# Patient Record
Sex: Male | Born: 1990 | Race: Black or African American | Hispanic: No | Marital: Single | Smoking: Never smoker
Health system: Southern US, Community
[De-identification: ages and names within clinical notes are randomized; demographics above are authoritative.]

## PROBLEM LIST (undated history)

## (undated) DIAGNOSIS — J45909 Unspecified asthma, uncomplicated: Secondary | ICD-10-CM

---

## 2013-09-16 ENCOUNTER — Other Ambulatory Visit: Payer: Self-pay | Admitting: Family Medicine

## 2013-09-16 ENCOUNTER — Ambulatory Visit
Admission: RE | Admit: 2013-09-16 | Discharge: 2013-09-16 | Disposition: A | Discharge status: Home / Self Care | Payer: Managed Care, Other (non HMO) | Source: Ambulatory Visit | Attending: Family Medicine | Admitting: Family Medicine

## 2013-09-16 DIAGNOSIS — M79609 Pain in unspecified limb: Secondary | ICD-10-CM

## 2015-04-26 ENCOUNTER — Emergency Department (HOSPITAL_COMMUNITY): Payer: Worker's Compensation | Admitting: Anesthesiology

## 2015-04-26 ENCOUNTER — Inpatient Hospital Stay (HOSPITAL_COMMUNITY)
Admission: EM | Admit: 2015-04-26 | Discharge: 2015-05-01 | DRG: 464 | Disposition: A | Discharge status: Home Health | Payer: Worker's Compensation | Attending: Orthopedic Surgery | Admitting: Orthopedic Surgery

## 2015-04-26 ENCOUNTER — Emergency Department (HOSPITAL_COMMUNITY): Payer: Worker's Compensation

## 2015-04-26 ENCOUNTER — Encounter (HOSPITAL_COMMUNITY): Payer: Self-pay | Admitting: Neurology

## 2015-04-26 ENCOUNTER — Encounter (HOSPITAL_COMMUNITY)
Admission: EM | Disposition: A | Discharge status: Home Health | Payer: Self-pay | Source: Home / Self Care | Attending: Orthopedic Surgery

## 2015-04-26 DIAGNOSIS — W319XXA Contact with unspecified machinery, initial encounter: Secondary | ICD-10-CM | POA: Diagnosis not present

## 2015-04-26 DIAGNOSIS — Z880 Allergy status to penicillin: Secondary | ICD-10-CM

## 2015-04-26 DIAGNOSIS — Y9269 Other specified industrial and construction area as the place of occurrence of the external cause: Secondary | ICD-10-CM

## 2015-04-26 DIAGNOSIS — S62151A Displaced fracture of hook process of hamate [unciform] bone, right wrist, initial encounter for closed fracture: Secondary | ICD-10-CM | POA: Diagnosis present

## 2015-04-26 DIAGNOSIS — S52611A Displaced fracture of right ulna styloid process, initial encounter for closed fracture: Secondary | ICD-10-CM | POA: Diagnosis present

## 2015-04-26 DIAGNOSIS — Z881 Allergy status to other antibiotic agents status: Secondary | ICD-10-CM | POA: Diagnosis not present

## 2015-04-26 DIAGNOSIS — S62302A Unspecified fracture of third metacarpal bone, right hand, initial encounter for closed fracture: Secondary | ICD-10-CM | POA: Diagnosis present

## 2015-04-26 DIAGNOSIS — T79A11A Traumatic compartment syndrome of right upper extremity, initial encounter: Secondary | ICD-10-CM | POA: Diagnosis present

## 2015-04-26 DIAGNOSIS — S62304A Unspecified fracture of fourth metacarpal bone, right hand, initial encounter for closed fracture: Secondary | ICD-10-CM | POA: Diagnosis present

## 2015-04-26 DIAGNOSIS — S62309A Unspecified fracture of unspecified metacarpal bone, initial encounter for closed fracture: Secondary | ICD-10-CM

## 2015-04-26 DIAGNOSIS — S4991XA Unspecified injury of right shoulder and upper arm, initial encounter: Secondary | ICD-10-CM

## 2015-04-26 DIAGNOSIS — S6720XA Crushing injury of unspecified hand, initial encounter: Secondary | ICD-10-CM | POA: Diagnosis present

## 2015-04-26 DIAGNOSIS — S52501A Unspecified fracture of the lower end of right radius, initial encounter for closed fracture: Secondary | ICD-10-CM | POA: Diagnosis present

## 2015-04-26 HISTORY — PX: ORIF WRIST FRACTURE: SHX2133

## 2015-04-26 HISTORY — PX: FASCIECTOMY: SHX6525

## 2015-04-26 SURGERY — OPEN REDUCTION INTERNAL FIXATION (ORIF) WRIST FRACTURE
Anesthesia: General | Laterality: Right

## 2015-04-26 MED ORDER — ONDANSETRON HCL 4 MG/2ML IJ SOLN
INTRAMUSCULAR | Status: DC | PRN
  Administered 2015-04-26: 4 mg via INTRAVENOUS

## 2015-04-26 MED ORDER — SODIUM CHLORIDE 0.9 % IJ SOLN
INTRAMUSCULAR | Status: AC
  Filled 2015-04-26: qty 10

## 2015-04-26 MED ORDER — HYDROMORPHONE HCL 1 MG/ML IJ SOLN
0.2500 mg | INTRAMUSCULAR | Status: DC | PRN
  Administered 2015-04-26 (×2): 0.5 mg via INTRAVENOUS

## 2015-04-26 MED ORDER — MIDAZOLAM HCL 2 MG/2ML IJ SOLN
INTRAMUSCULAR | Status: AC
  Filled 2015-04-26: qty 2

## 2015-04-26 MED ORDER — LIDOCAINE HCL (CARDIAC) 20 MG/ML IV SOLN
INTRAVENOUS | Status: DC | PRN
  Administered 2015-04-26: 100 mg via INTRAVENOUS

## 2015-04-26 MED ORDER — VANCOMYCIN HCL 1000 MG IV SOLR
1000.0000 mg | INTRAVENOUS | Status: DC | PRN
  Administered 2015-04-26: 1000 mg via INTRAVENOUS

## 2015-04-26 MED ORDER — VANCOMYCIN HCL IN DEXTROSE 1-5 GM/200ML-% IV SOLN
INTRAVENOUS | Status: AC
  Filled 2015-04-26: qty 200

## 2015-04-26 MED ORDER — KETOROLAC TROMETHAMINE 30 MG/ML IJ SOLN
30.0000 mg | Freq: Once | INTRAMUSCULAR | Status: AC | PRN
  Administered 2015-04-26: 30 mg via INTRAVENOUS

## 2015-04-26 MED ORDER — ONDANSETRON HCL 4 MG/2ML IJ SOLN
INTRAMUSCULAR | Status: AC
  Filled 2015-04-26: qty 2

## 2015-04-26 MED ORDER — HYDROMORPHONE HCL 1 MG/ML IJ SOLN
INTRAMUSCULAR | Status: AC
  Filled 2015-04-26: qty 1

## 2015-04-26 MED ORDER — LIDOCAINE HCL (CARDIAC) 20 MG/ML IV SOLN
INTRAVENOUS | Status: AC
  Filled 2015-04-26: qty 5

## 2015-04-26 MED ORDER — BACITRACIN ZINC 500 UNIT/GM EX OINT
TOPICAL_OINTMENT | CUTANEOUS | Status: AC
  Filled 2015-04-26: qty 28.35

## 2015-04-26 MED ORDER — BUPIVACAINE HCL (PF) 0.25 % IJ SOLN
INTRAMUSCULAR | Status: AC
  Filled 2015-04-26: qty 30

## 2015-04-26 MED ORDER — LACTATED RINGERS IV SOLN
INTRAVENOUS | Status: DC
  Administered 2015-04-27 – 2015-04-29 (×3): via INTRAVENOUS

## 2015-04-26 MED ORDER — PROPOFOL 10 MG/ML IV BOLUS
INTRAVENOUS | Status: DC | PRN
  Administered 2015-04-26: 150 mg via INTRAVENOUS

## 2015-04-26 MED ORDER — FENTANYL CITRATE (PF) 100 MCG/2ML IJ SOLN: 50.0000 ug | INTRAMUSCULAR | Status: DC | PRN

## 2015-04-26 MED ORDER — KETOROLAC TROMETHAMINE 30 MG/ML IJ SOLN
INTRAMUSCULAR | Status: AC
  Filled 2015-04-26: qty 1

## 2015-04-26 MED ORDER — DOUBLE ANTIBIOTIC 500-10000 UNIT/GM EX OINT
TOPICAL_OINTMENT | CUTANEOUS | Status: AC
  Filled 2015-04-26: qty 1

## 2015-04-26 MED ORDER — DEXAMETHASONE SODIUM PHOSPHATE 4 MG/ML IJ SOLN
INTRAMUSCULAR | Status: DC | PRN
  Administered 2015-04-26: 4 mg via INTRAVENOUS

## 2015-04-26 MED ORDER — SUCCINYLCHOLINE CHLORIDE 20 MG/ML IJ SOLN
INTRAMUSCULAR | Status: DC | PRN
  Administered 2015-04-26: 100 mg via INTRAVENOUS

## 2015-04-26 MED ORDER — DEXAMETHASONE SODIUM PHOSPHATE 4 MG/ML IJ SOLN
INTRAMUSCULAR | Status: AC
  Filled 2015-04-26: qty 1

## 2015-04-26 MED ORDER — LACTATED RINGERS IV SOLN
INTRAVENOUS | Status: DC | PRN
  Administered 2015-04-26 (×2): via INTRAVENOUS

## 2015-04-26 MED ORDER — MIDAZOLAM HCL 5 MG/5ML IJ SOLN
INTRAMUSCULAR | Status: DC | PRN
  Administered 2015-04-26: 2 mg via INTRAVENOUS

## 2015-04-26 MED ORDER — SUFENTANIL CITRATE 50 MCG/ML IV SOLN
INTRAVENOUS | Status: AC
  Filled 2015-04-26: qty 1

## 2015-04-26 MED ORDER — PROMETHAZINE HCL 25 MG/ML IJ SOLN
6.2500 mg | INTRAMUSCULAR | Status: DC | PRN
  Filled 2015-04-26: qty 1

## 2015-04-26 MED ORDER — SUCCINYLCHOLINE CHLORIDE 20 MG/ML IJ SOLN
INTRAMUSCULAR | Status: AC
  Filled 2015-04-26: qty 1

## 2015-04-26 MED ORDER — PROPOFOL 10 MG/ML IV BOLUS
INTRAVENOUS | Status: AC
  Filled 2015-04-26: qty 20

## 2015-04-26 MED ORDER — 0.9 % SODIUM CHLORIDE (POUR BTL) OPTIME
TOPICAL | Status: DC | PRN
  Administered 2015-04-26: 1000 mL

## 2015-04-26 MED ORDER — OXYCODONE HCL 5 MG PO TABS
5.0000 mg | ORAL_TABLET | ORAL | Status: DC | PRN
  Administered 2015-04-27 (×3): 10 mg via ORAL
  Administered 2015-04-30 – 2015-05-01 (×2): 5 mg via ORAL
  Filled 2015-04-26: qty 1
  Filled 2015-04-26 (×2): qty 2
  Filled 2015-04-26: qty 1
  Filled 2015-04-26 (×2): qty 2

## 2015-04-26 MED ORDER — TETANUS-DIPHTH-ACELL PERTUSSIS 5-2.5-18.5 LF-MCG/0.5 IM SUSP
0.5000 mL | Freq: Once | INTRAMUSCULAR | Status: AC
  Administered 2015-04-26: 0.5 mL via INTRAMUSCULAR
  Filled 2015-04-26: qty 0.5

## 2015-04-26 MED ORDER — HYDROMORPHONE HCL 1 MG/ML IJ SOLN
1.0000 mg | INTRAMUSCULAR | Status: DC | PRN
  Administered 2015-04-26: 1 mg via INTRAVENOUS

## 2015-04-26 MED ORDER — SUFENTANIL CITRATE 50 MCG/ML IV SOLN
INTRAVENOUS | Status: DC | PRN
  Administered 2015-04-26 (×2): 10 ug via INTRAVENOUS

## 2015-04-26 SURGICAL SUPPLY — 78 items
BANDAGE ELASTIC 3 VELCRO ST LF (GAUZE/BANDAGES/DRESSINGS) ×3 IMPLANT
BANDAGE ELASTIC 4 VELCRO ST LF (GAUZE/BANDAGES/DRESSINGS) ×3 IMPLANT
BIT DRILL 2.2 SS TIBIAL (BIT) ×3 IMPLANT
BIT DRILL MINI LNG ACUTRAK 2 (BIT) ×1 IMPLANT
BLADE SURG ROTATE 9660 (MISCELLANEOUS) IMPLANT
BNDG ESMARK 4X9 LF (GAUZE/BANDAGES/DRESSINGS) ×3 IMPLANT
BNDG GAUZE ELAST 4 BULKY (GAUZE/BANDAGES/DRESSINGS) ×3 IMPLANT
CANISTER WOUND CARE 500ML ATS (WOUND CARE) ×3 IMPLANT
CORDS BIPOLAR (ELECTRODE) ×3 IMPLANT
COVER SURGICAL LIGHT HANDLE (MISCELLANEOUS) ×3 IMPLANT
CUFF TOURNIQUET SINGLE 18IN (TOURNIQUET CUFF) ×3 IMPLANT
CUFF TOURNIQUET SINGLE 24IN (TOURNIQUET CUFF) IMPLANT
DECANTER SPIKE VIAL GLASS SM (MISCELLANEOUS) IMPLANT
DRAIN TLS ROUND 10FR (DRAIN) IMPLANT
DRAPE INCISE IOBAN 66X45 STRL (DRAPES) ×3 IMPLANT
DRAPE OEC MINIVIEW 54X84 (DRAPES) IMPLANT
DRAPE SURG 17X23 STRL (DRAPES) ×3 IMPLANT
DRAPE U-SHAPE 47X51 STRL (DRAPES) ×3 IMPLANT
DRILL MINI LNG ACUTRAK 2 (BIT) ×3
DRSG ADAPTIC 3X8 NADH LF (GAUZE/BANDAGES/DRESSINGS) ×3 IMPLANT
DRSG VAC ATS SM SENSATRAC (GAUZE/BANDAGES/DRESSINGS) ×3 IMPLANT
GAUZE SPONGE 4X4 12PLY STRL (GAUZE/BANDAGES/DRESSINGS) ×3 IMPLANT
GAUZE SPONGE 4X4 16PLY XRAY LF (GAUZE/BANDAGES/DRESSINGS) ×3 IMPLANT
GAUZE XEROFORM 1X8 LF (GAUZE/BANDAGES/DRESSINGS) ×3 IMPLANT
GAUZE XEROFORM 5X9 LF (GAUZE/BANDAGES/DRESSINGS) ×3 IMPLANT
GLOVE BIOGEL M STRL SZ7.5 (GLOVE) ×3 IMPLANT
GLOVE ORTHO TXT STRL SZ7.5 (GLOVE) ×6 IMPLANT
GLOVE SS BIOGEL STRL SZ 8 (GLOVE) ×3 IMPLANT
GLOVE SUPERSENSE BIOGEL SZ 8 (GLOVE) ×6
GOWN STRL REUS W/ TWL LRG LVL3 (GOWN DISPOSABLE) ×3 IMPLANT
GOWN STRL REUS W/ TWL XL LVL3 (GOWN DISPOSABLE) ×3 IMPLANT
GOWN STRL REUS W/TWL LRG LVL3 (GOWN DISPOSABLE) ×6
GOWN STRL REUS W/TWL XL LVL3 (GOWN DISPOSABLE) ×6
GUIDEWIRE ORTHO 0.054X7 SS (WIRE) ×3 IMPLANT
GUIDEWIRE ORTHO MINI ACTK .045 (WIRE) ×3 IMPLANT
KIT BASIN OR (CUSTOM PROCEDURE TRAY) ×3 IMPLANT
KIT ROOM TURNOVER OR (KITS) ×3 IMPLANT
LOOP VESSEL MAXI BLUE (MISCELLANEOUS) IMPLANT
MANIFOLD NEPTUNE II (INSTRUMENTS) ×3 IMPLANT
NEEDLE 22X1 1/2 (OR ONLY) (NEEDLE) IMPLANT
NS IRRIG 1000ML POUR BTL (IV SOLUTION) ×3 IMPLANT
PACK ORTHO EXTREMITY (CUSTOM PROCEDURE TRAY) ×3 IMPLANT
PAD ARMBOARD 7.5X6 YLW CONV (MISCELLANEOUS) ×6 IMPLANT
PAD CAST 3X4 CTTN HI CHSV (CAST SUPPLIES) ×1 IMPLANT
PAD CAST 4YDX4 CTTN HI CHSV (CAST SUPPLIES) ×1 IMPLANT
PADDING CAST COTTON 3X4 STRL (CAST SUPPLIES) ×2
PADDING CAST COTTON 4X4 STRL (CAST SUPPLIES) ×2
PEG LOCKING SMOOTH 2.2X20 (Screw) ×3 IMPLANT
PLATE NARROW DVR RIGHT (Plate) ×3 IMPLANT
SCREW ACUTRAK 2 MINI 20MM (Screw) ×3 IMPLANT
SCREW LOCK 16X2.7X 3 LD TPR (Screw) ×1 IMPLANT
SCREW LOCK 18X2.7X 3 LD TPR (Screw) ×1 IMPLANT
SCREW LOCK 20X2.7X 3 LD TPR (Screw) ×1 IMPLANT
SCREW LOCK 22X2.7X 3 LD TPR (Screw) ×3 IMPLANT
SCREW LOCKING 2.7X15MM (Screw) ×3 IMPLANT
SCREW LOCKING 2.7X16 (Screw) ×2 IMPLANT
SCREW LOCKING 2.7X18 (Screw) ×2 IMPLANT
SCREW LOCKING 2.7X20MM (Screw) ×2 IMPLANT
SCREW LOCKING 2.7X22MM (Screw) ×6 IMPLANT
SPLINT FIBERGLASS 3X12 (CAST SUPPLIES) ×6 IMPLANT
SPLINT FIBERGLASS 4X30 (CAST SUPPLIES) ×3 IMPLANT
SPONGE GAUZE 4X4 12PLY STER LF (GAUZE/BANDAGES/DRESSINGS) ×3 IMPLANT
SPONGE LAP 4X18 X RAY DECT (DISPOSABLE) IMPLANT
SUCTION FRAZIER TIP 8 FR DISP (SUCTIONS) ×2
SUCTION TUBE FRAZIER 8FR DISP (SUCTIONS) ×1 IMPLANT
SUT BONE WAX W31G (SUTURE) ×3 IMPLANT
SUT MNCRL AB 4-0 PS2 18 (SUTURE) ×3 IMPLANT
SUT PROLENE 3 0 PS 2 (SUTURE) ×9 IMPLANT
SUT VIC AB 3-0 FS2 27 (SUTURE) ×3 IMPLANT
SYR CONTROL 10ML LL (SYRINGE) IMPLANT
SYSTEM CHEST DRAIN TLS 7FR (DRAIN) IMPLANT
TOWEL OR 17X24 6PK STRL BLUE (TOWEL DISPOSABLE) ×3 IMPLANT
TOWEL OR 17X26 10 PK STRL BLUE (TOWEL DISPOSABLE) ×3 IMPLANT
TUBE CONNECTING 12'X1/4 (SUCTIONS) ×1
TUBE CONNECTING 12X1/4 (SUCTIONS) ×2 IMPLANT
TUBE EVACUATION TLS (MISCELLANEOUS) ×3 IMPLANT
UNDERPAD 30X30 INCONTINENT (UNDERPADS AND DIAPERS) ×3 IMPLANT
WATER STERILE IRR 1000ML POUR (IV SOLUTION) ×3 IMPLANT

## 2015-04-26 NOTE — Anesthesia Postprocedure Evaluation (Signed)
  Anesthesia Post-op Note  Patient: Jerome Horton  Procedure(s) Performed: Procedure(s) (LRB): OPEN REDUCTION INTERNAL FIXATION (ORIF) WRIST FRACTURE (Right) FASCIECTOMY (Right)  Patient Location: PACU  Anesthesia Type: General  Level of Consciousness: awake and alert   Airway and Oxygen Therapy: Patient Spontanous Breathing  Post-op Pain: mild  Post-op Assessment: Post-op Vital signs reviewed, Patient's Cardiovascular Status Stable, Respiratory Function Stable, Patent Airway and No signs of Nausea or vomiting  Last Vitals:  Filed Vitals:   04/26/15 2245  BP:   Pulse: 78  Temp:   Resp: 14    Post-op Vital Signs: stable   Complications: No apparent anesthesia complications

## 2015-04-26 NOTE — ED Notes (Signed)
Per ems- [place] [employer], a piece of luggage was about to fall when he tried to catch it and his arm was bent backwards by the conveyor belt. Closed deformity to right forearm and wrist. Given 150 fentanyl, BP 148/86, HR 80.

## 2015-04-26 NOTE — H&P (Signed)
Jerome Horton is an 24 y.o. male.   Chief Complaint: Crush injury to the right hand and wrist HPI: The patient is a pleasant 24 year old male who presents to the emergency room setting for an acute evaluation of his right wrist. He was involved in an on-the-job injury earlier today approximately 3 PM. He is employed at [employer] and was loading luggage in his hand and the distal wrist region became trapped in the conveyor belt. This resulted in a crushing injury to the hand and wrist. The patient had increasing swelling pain and disability to the hand he was seen and evaluated emergency room setting. He was noted to have fractures of the third and fourth metacarpal, transverse in nature as well as a displaced interarticular distal radius fracture and ulna styloid fracture. We will consult in regards to his upper extremity injury. Patient complains of significant pain about the hand and difficulty moving the fingers secondary to pain. He denies any significant numbness at this juncture. He denies any injury to the proximal forearm and elbow or shoulder or contralateral upper extremity or lower extremities.  History reviewed. No pertinent past medical history.  History reviewed. No pertinent past surgical history.  No family history on file. Social History:  reports that he has never smoked. He does not have any smokeless tobacco history on file. He reports that he does not drink alcohol. His drug history is not on file.  Allergies:  Allergies  Allergen Reactions  . Amoxicillin Anaphylaxis and Hives  . Penicillins Anaphylaxis and Hives     (Not in a hospital admission)  No results found for this or any previous visit (from the past 48 hour(s)). Dg Forearm Right  04/26/2015   CLINICAL DATA:  Right upper extremity [place] a [place] convey [place].  EXAM: RIGHT FOREARM - 2 VIEW  COMPARISON:  None.  FINDINGS: There are transverse fractures of the distal radius and ulnar styloid base  with slight dorsal displacement. The radius fracture spares the articular surface. There also are fractures of the third and fourth metacarpal midshafts, nondisplaced. There is no radiopaque foreign body.  IMPRESSION: Fractures of the distal radius and ulnar styloid. Fractures of third and fourth metacarpals.   Electronically Signed   By: Ellery Plunk M.D.   On: 04/26/2015 17:26   Dg Hand 2 View Right  04/26/2015   CLINICAL DATA:  Hand caught [place] conveyor [place] the airport.  EXAM: RIGHT HAND - 2 VIEW  COMPARISON:  Right index finger radiographs 09/16/2013  FINDINGS: The transverse fracture is present through the midshaft of the third and fourth metatarsals without significant displacement. There is a comminuted distal radius fracture with probable intra-articular extension and slight dorsal angulation. An ulnar styloid fracture is evident. No additional fractures are present. Extensive soft tissue swelling is present over the hand and wrist. No radiopaque foreign body is evident.  IMPRESSION: 1. Nondisplaced transverse fractures through the midshaft of the third and fourth metacarpals. 2. Comminuted distal radius fracture with slight dorsal angulation probable intra-articular extension. 3. Nondisplaced ulnar styloid fracture. 4. Extensive soft tissue swelling without radiopaque foreign body.   Electronically Signed   By: Marin Roberts M.D.   On: 04/26/2015 17:28    Review of Systems  Constitutional: Negative.   HENT: Negative.   Eyes: Negative.   Cardiovascular: Negative.   Gastrointestinal: Negative.   Musculoskeletal:       History of present illness  Skin: Negative.   Neurological: Negative.  Endo/Heme/Allergies: Negative.     Blood pressure 138/77, pulse 67, temperature 98.1 F (36.7 C), temperature source Oral, resp. rate 19, SpO2 100 %. Physical Exam  Physical examination of the right upper extremity: Patient is noted to have significant abrasions about the volar  aspect of his wrist and distal forearm region. He has a superficial skin tear about the middle finger dorsally over the DIP region. He is noted to have significant swelling about the thenar eminence evolving swelling about the mid palmar space and to a lesser extent the hyperthenar eminence. He has significant edema of the digits and dorsal aspect of the hand. His fingers are held in a flexed position with significant pain with any attempts at passive extension he is unable to flex or extend actively secondary to pain. Refill is intact to sensation is intact about the median and ulnar nerve  distribution. .The patient is alert and oriented in no acute distress. The patient complains of pain in the affected upper extremity.  The patient is noted to have a normal HEENT exam. Lung fields show equal chest expansion and no shortness of breath. Abdomen exam is nontender without distention. Lower extremity examination does not show any fracture dislocation or blood clot symptoms. Pelvis is stable and the neck and back are stable and nontender.  Assessment/Plan Status post on-the-job injury Crush injury to the right hand and wrist evolving compartment syndrome Right third and fourth metacarpal fractures Right distal radius and ulna fractures  We have discussed with the patient and his family are concerns in regards to his extremity and significant crush injury. We have discussed with him our concerns for progressive compartment syndrome and does have recommended urgent operative intervention fasciotomies as needed as well as ORIF of his radius fixation of the metacarpals as needed. We discussed all issues with him as well as the potential need for secondary surgeries and delayed closure/wound VAC, etc. We are planning surgery for your upper extremity. The risk and benefits of surgery to include risk of bleeding, infection, anesthesia,  damage to normal structures and failure of the surgery to accomplish its  intended goals of relieving symptoms and restoring function have been discussed in detail. With this in mind we plan to proceed. I have specifically discussed with the patient the pre-and postoperative regime and the dos and don'ts and risk and benefits in great detail. Risk and benefits of surgery also include risk of dystrophy(CRPS), chronic nerve pain, failure of the healing process to go onto completion and other inherent risks of surgery The relavent the pathophysiology of the disease/injury process, as well as the alternatives for treatment and postoperative course of action has been discussed in great detail with the patient who desires to proceed.  We will do everything in our power to help you (the patient) restore function to the upper extremity. It is a pleasure to see this patient today.   Dalayla Aldredge L 04/26/2015, 7:22 PM

## 2015-04-26 NOTE — ED Notes (Signed)
Patient transported to X-ray 

## 2015-04-26 NOTE — Anesthesia Procedure Notes (Signed)
Procedure Name: Intubation Date/Time: 04/26/2015 8:12 PM Performed by: Melina Schools Pre-anesthesia Checklist: Patient identified, Emergency Drugs available, Suction available, Patient being monitored and Timeout performed Patient Re-evaluated:Patient Re-evaluated prior to inductionOxygen Delivery Method: Circle system utilized Preoxygenation: Pre-oxygenation with 100% oxygen Intubation Type: IV induction, Rapid sequence and Cricoid Pressure applied Ventilation: Mask ventilation without difficulty Laryngoscope Size: Mac and 3 Grade View: Grade I Tube type: Oral Tube size: 8.0 mm Number of attempts: 1 Airway Equipment and Method: Stylet Placement Confirmation: ETT inserted through vocal cords under direct vision,  positive ETCO2 and breath sounds checked- equal and bilateral Secured at: 23 cm Tube secured with: Tape Dental Injury: Teeth and Oropharynx as per pre-operative assessment

## 2015-04-26 NOTE — ED Notes (Signed)
Patient to go to Short Stay 36  Dr Amanda Pea ar bedside

## 2015-04-26 NOTE — Op Note (Signed)
See dictation# [id number] Amanda Pea MD

## 2015-04-26 NOTE — ED Notes (Signed)
Patient stated he does not have a wallet or any jewelry

## 2015-04-26 NOTE — ED Provider Notes (Signed)
CSN: [id number]     Arrival date & time 04/26/15  1645 History   First MD Initiated Contact with Patient 04/26/15 1648     Chief Complaint  Patient presents with  . Arm Injury   (Consider location/radiation/quality/duration/timing/severity/associated sxs/prior Treatment) Patient is a 24 y.o. male presenting with arm injury. The history is provided by the patient. No language interpreter was used.  Arm Injury Location:  Wrist and arm Injury: yes   Mechanism of injury: crush   Crush injury:    Mechanism:  Industrial machinery Arm location:  R forearm Wrist location:  R wrist Pain details:    Quality:  Aching   Radiates to:  Does not radiate   Severity:  Severe   Onset quality:  Sudden   Timing:  Constant Chronicity:  New Handedness:  Right-handed Dislocation: no   Foreign body present:  No foreign bodies Tetanus status:  Out of date Prior injury to area:  No Relieved by: fentanyl. Worsened by:  Movement and bearing weight Ineffective treatments:  None tried Associated symptoms: swelling   Associated symptoms: no back pain, no fatigue, no fever, no numbness and no stiffness   Risk factors: no concern for non-accidental trauma and no frequent fractures     History reviewed. No pertinent past medical history. History reviewed. No pertinent past surgical history. No family history on file. History  Substance Use Topics  . Smoking status: Never Smoker   . Smokeless tobacco: Not on file  . Alcohol Use: No    Review of Systems  Constitutional: Negative for fever and fatigue.  Respiratory: Negative for chest tightness and shortness of breath.   Cardiovascular: Negative for chest pain.  Gastrointestinal: Negative for nausea, vomiting and abdominal pain.  Musculoskeletal: Positive for myalgias (right forearm), joint swelling (right wrist) and arthralgias (right wrist). Negative for back pain and stiffness.  Neurological: Negative for light-headedness and headaches.   Psychiatric/Behavioral: Negative for confusion.  All other systems reviewed and are negative.     Allergies  Amoxicillin  Home Medications   Prior to Admission medications   Not on File   BP 114/65 mmHg  Pulse 72  Temp(Src) 98.1 F (36.7 C) (Oral)  Resp 16  SpO2 98% Physical Exam  Constitutional: He is oriented to person, place, and time. He appears well-developed and well-nourished. No distress.  HENT:  Head: Normocephalic and atraumatic.  Nose: Nose normal.  Mouth/Throat: Oropharynx is clear and moist. No oropharyngeal exudate.  Scalp atraumatic, no facial lacerations/abrasions.  No midface instability, no step offs.  Nontender diffusely.   Eyes: EOM are normal. Pupils are equal, round, and reactive to light.  Neck: Normal range of motion. Neck supple.  Cardiovascular: Normal rate, regular rhythm, normal heart sounds and intact distal pulses.   No murmur heard. Pulmonary/Chest: Effort normal and breath sounds normal. No respiratory distress. He has no wheezes. He exhibits no tenderness.  Abdominal: Soft. He exhibits no distension. There is no tenderness. There is no guarding.  Musculoskeletal: He exhibits edema and tenderness.  Gross deformity of right distal forearm and significant swelling to right hand.  Superficial abrasion to distal forearm.  1+ radial pulses.  Able to move all fingers but ROM significantly decreased 2/2 pain.  Sensation intact but decreased over majority of right hand.  Nontender proximal to right elbow. Otherwise atraumatic.     Neurological: He is alert and oriented to person, place, and time. No cranial nerve deficit. Coordination normal.  Skin: Skin is warm and dry. He is  not diaphoretic. No pallor.  Psychiatric: He has a normal mood and affect. His behavior is normal. Judgment and thought content normal.  Nursing note and vitals reviewed.   ED Course  Procedures (including critical care time) Labs Review Labs Reviewed - No data to  display  Imaging Review Dg Forearm Right  04/26/2015   CLINICAL DATA:  Right upper extremity caught [place] a luggage convey [place] airport.  EXAM: RIGHT FOREARM - 2 VIEW  COMPARISON:  None.  FINDINGS: There are transverse fractures of the distal radius and ulnar styloid base with slight dorsal displacement. The radius fracture spares the articular surface. There also are fractures of the third and fourth metacarpal midshafts, nondisplaced. There is no radiopaque foreign body.  IMPRESSION: Fractures of the distal radius and ulnar styloid. Fractures of third and fourth metacarpals.   Electronically Signed   By: Ellery Plunk M.D.   On: 04/26/2015 17:26   Dg Hand 2 View Right  04/26/2015   CLINICAL DATA:  Hand caught [place] the airport.  EXAM: RIGHT HAND - 2 VIEW  COMPARISON:  Right index finger radiographs 09/16/2013  FINDINGS: The transverse fracture is present through the midshaft of the third and fourth metatarsals without significant displacement. There is a comminuted distal radius fracture with probable intra-articular extension and slight dorsal angulation. An ulnar styloid fracture is evident. No additional fractures are present. Extensive soft tissue swelling is present over the hand and wrist. No radiopaque foreign body is evident.  IMPRESSION: 1. Nondisplaced transverse fractures through the midshaft of the third and fourth metacarpals. 2. Comminuted distal radius fracture with slight dorsal angulation probable intra-articular extension. 3. Nondisplaced ulnar styloid fracture. 4. Extensive soft tissue swelling without radiopaque foreign body.   Electronically Signed   By: Marin Roberts M.D.   On: 04/26/2015 17:28     EKG Interpretation None      MDM   Final diagnoses:  Arm injury, right, initial encounter  Distal radial fracture, right, closed, initial encounter  Fracture of right ulnar styloid, closed, initial encounter  Multiple fractures of  metacarpal bones, closed, initial encounter   Pt is a right hand dominant 24 yo M with no PMH who presented with right arm pain/deformity.  [place] and got his right upper extremity caught in the [employer], arts belt around 1530.  Did not have significant crushing injury or stay in the belt for more than a few seconds, but did cause his arm to be pulled in an abnormal position.  Felt immediate pain to his right forearm and wrist.   Received pain meds prior to arrival and pain was well controlled initially.  Gross deformity of distal forearm and significant swelling to right hand.  Superficial abrasion to distal forearm.  1+ radial pulses.  Able to move all fingers but ROM significantly decreased 2/2 pain.  Sensation intact but decreased over majority of right hand.   Arm elevated and ice applied.  Given pain meds and Tdap updated  RUE xrays show a distal radial fracture, ulnar styloid fracture, and 3-4th metacarpal fractures.    Hand (Dr. Amanda Pea) consulted.  To go to the OR with ortho.  Patient agreeable and stable for transfer out of the ED.   Imaging was reviewed and interpreted by myself and my attending, and incorporated in the medical decision making.  Patient was seen with ED Attending, Dr. Derrick Ravel, MD   Lenell Antu, MD 04/27/15 [phone]  Gerhard Munch, MD 04/29/15 1018

## 2015-04-26 NOTE — Transfer of Care (Signed)
Immediate Anesthesia Transfer of Care Note  Patient: Jerome Horton  Procedure(s) Performed: Procedure(s): OPEN REDUCTION INTERNAL FIXATION (ORIF) WRIST FRACTURE (Right) FASCIECTOMY (Right)  Patient Location: PACU  Anesthesia Type:General  Level of Consciousness: oriented, sedated, patient cooperative and responds to stimulation  Airway & Oxygen Therapy: Patient Spontanous Breathing and Patient connected to nasal cannula oxygen  Post-op Assessment: Report given to RN, Post -op Vital signs reviewed and stable and Patient moving all extremities X 4  Post vital signs: Reviewed and stable  Last Vitals:  Filed Vitals:   04/26/15 1915  BP: 139/80  Pulse: 67  Temp:   Resp:     Complications: No apparent anesthesia complications

## 2015-04-26 NOTE — Anesthesia Preprocedure Evaluation (Signed)
Anesthesia Evaluation  Patient identified by MRN, date of birth, ID band Patient awake    Reviewed: Allergy & Precautions, NPO status , Patient's Chart, lab work & pertinent test results  Airway Mallampati: II  TM Distance: >3 FB Neck ROM: Full    Dental no notable dental hx.    Pulmonary neg pulmonary ROS,  breath sounds clear to auscultation  Pulmonary exam normal       Cardiovascular negative cardio ROS Normal cardiovascular examRhythm:Regular Rate:Normal     Neuro/Psych negative neurological ROS  negative psych ROS   GI/Hepatic negative GI ROS, Neg liver ROS,   Endo/Other  negative endocrine ROS  Renal/GU negative Renal ROS  negative genitourinary   Musculoskeletal negative musculoskeletal ROS (+)   Abdominal   Peds negative pediatric ROS (+)  Hematology negative hematology ROS (+)   Anesthesia Other Findings   Reproductive/Obstetrics negative OB ROS                             Anesthesia Physical Anesthesia Plan  ASA: I and emergent  Anesthesia Plan: General   Post-op Pain Management:    Induction: Intravenous  Airway Management Planned: Oral ETT  Additional Equipment:   Intra-op Plan:   Post-operative Plan: Extubation in OR  Informed Consent: I have reviewed the patients History and Physical, chart, labs and discussed the procedure including the risks, benefits and alternatives for the proposed anesthesia with the patient or authorized representative who has indicated his/her understanding and acceptance.   Dental advisory given  Plan Discussed with: CRNA and Surgeon  Anesthesia Plan Comments:         Anesthesia Quick Evaluation

## 2015-04-27 ENCOUNTER — Encounter (HOSPITAL_COMMUNITY): Payer: Self-pay | Admitting: Orthopedic Surgery

## 2015-04-27 LAB — BASIC METABOLIC PANEL
Anion gap: 10 (ref 5–15)
BUN: 10 mg/dL (ref 6–20)
CHLORIDE: 104 mmol/L (ref 101–111)
CO2: 24 mmol/L (ref 22–32)
Calcium: 9.1 mg/dL (ref 8.9–10.3)
Creatinine, Ser: 1.1 mg/dL (ref 0.61–1.24)
GFR calc Af Amer: >60 mL/min (ref >60)
GFR calc non Af Amer: >60 mL/min (ref >60)
GLUCOSE: 142 mg/dL — AB (ref 65–99)
Potassium: 3.8 mmol/L (ref 3.5–5.1)
Sodium: 138 mmol/L (ref 135–145)

## 2015-04-27 MED ORDER — PROMETHAZINE HCL 25 MG RE SUPP: 12.5000 mg | Freq: Four times a day (QID) | RECTAL | Status: DC | PRN

## 2015-04-27 MED ORDER — SENNA 8.6 MG PO TABS
1.0000 | ORAL_TABLET | Freq: Two times a day (BID) | ORAL | Status: DC
  Administered 2015-04-27 – 2015-05-01 (×8): 8.6 mg via ORAL
  Filled 2015-04-27 (×7): qty 1

## 2015-04-27 MED ORDER — DEXTROSE 5 % IV SOLN
500.0000 mg | Freq: Four times a day (QID) | INTRAVENOUS | Status: DC | PRN
  Filled 2015-04-27: qty 5

## 2015-04-27 MED ORDER — ONDANSETRON HCL 4 MG/2ML IJ SOLN
4.0000 mg | Freq: Four times a day (QID) | INTRAMUSCULAR | Status: DC | PRN
  Administered 2015-04-27 – 2015-04-29 (×2): 4 mg via INTRAVENOUS
  Filled 2015-04-27: qty 2

## 2015-04-27 MED ORDER — VANCOMYCIN HCL IN DEXTROSE 1-5 GM/200ML-% IV SOLN
1000.0000 mg | Freq: Two times a day (BID) | INTRAVENOUS | Status: DC
  Administered 2015-04-27 – 2015-04-29 (×6): 1000 mg via INTRAVENOUS
  Filled 2015-04-27 (×8): qty 200

## 2015-04-27 MED ORDER — HYDROMORPHONE HCL 1 MG/ML IJ SOLN
0.5000 mg | INTRAMUSCULAR | Status: DC | PRN
  Administered 2015-04-27 – 2015-04-28 (×3): 1 mg via INTRAVENOUS
  Filled 2015-04-27 (×3): qty 1

## 2015-04-27 MED ORDER — FAMOTIDINE 20 MG PO TABS: 20.0000 mg | ORAL_TABLET | Freq: Two times a day (BID) | ORAL | Status: DC | PRN

## 2015-04-27 MED ORDER — DIPHENHYDRAMINE HCL 25 MG PO CAPS: 25.0000 mg | ORAL_CAPSULE | Freq: Four times a day (QID) | ORAL | Status: DC | PRN

## 2015-04-27 MED ORDER — ALPRAZOLAM 0.5 MG PO TABS: 0.5000 mg | ORAL_TABLET | Freq: Four times a day (QID) | ORAL | Status: DC | PRN

## 2015-04-27 MED ORDER — ONDANSETRON HCL 4 MG PO TABS: 4.0000 mg | ORAL_TABLET | Freq: Four times a day (QID) | ORAL | Status: DC | PRN

## 2015-04-27 MED ORDER — METHOCARBAMOL 500 MG PO TABS: 500.0000 mg | ORAL_TABLET | Freq: Four times a day (QID) | ORAL | Status: DC | PRN

## 2015-04-27 NOTE — Progress Notes (Signed)
Subjective: 1 Day Post-Op Procedure(s) (LRB): OPEN REDUCTION INTERNAL FIXATION (ORIF) WRIST FRACTURE (Right) FASCIECTOMY (Right) Patient reports pain as well controlled. He denies any nausea, vomiting, fever or chills. He denies numbness or tingling about the hand. He has no complaints this morning and states he's doing wonderful. Objective: Vital signs in last 24 hours: Temp:  [97.6 F (36.4 C)-98.9 F (37.2 C)] 97.7 F (36.5 C) (07/27 0540) Pulse Rate:  [47-123] 123 (07/27 0540) Resp:  [13-19] 14 (07/27 0540) BP: (114-140)/(59-88) 120/59 mmHg (07/27 0540) SpO2:  [91 %-100 %] 91 % (07/27 0540) Weight:  [68.04 kg (150 lb)] 68.04 kg (150 lb) (07/26 1941)  Intake/Output from previous day: 07/26 0701 - 07/27 0700 In: 2001 [P.O.:240; I.V.:1761] Out: 351 [Urine:350; Emesis/NG output:1] Intake/Output this shift:    No results for input(s): HGB in the last 72 hours. No results for input(s): WBC, RBC, HCT, PLT in the last 72 hours.  Recent Labs  04/27/15 0315  NA 138  K 3.8  CL 104  CO2 24  BUN 10  CREATININE 1.10  GLUCOSE 142*  CALCIUM 9.1   No results for input(s): LABPT, INR in the last 72 hours.  The patient is awake, alert and oriented and looks exceptionally well given the degree of trauma he sustained to his hand HEENT: Atraumatic normocephalic Chest: Equal expansions are present respirations are nonlabored : Abdomen: Nontender Right upper extremity: Continuous VAC intact, dressings are clean and intact, patient hasn't minimal edema to the exposed digits with excellent refill present he can actively wiggle the fingers without any significant degree of pain, and sensation is intact, proximal forearm is without significant edema at this juncture there is no signs of cellulitis, currently no signs of compartment syndrome   Assessment/Plan: 1 Day Post-Op Procedure(s) (LRB): OPEN REDUCTION INTERNAL FIXATION (ORIF) WRIST FRACTURE (Right) FASCIECTOMY (Right) I have  discussed with the patient all issues and the severity of his crush injury. Truly, he looks excellent this morning and is in excellent spirits. We have discussed with him given the crush injury evolving compartment syndrome and inability to primarily close the wound, we will plan for a return trip to the operative suite for attempt at primary closure with possible skin graft. We went over all the details at length. We will continue the wound VAC, elevation and close observation. We will plan to return to the operative suite within the next 24-48 hours. All questions were encouraged and answered he is a delightful gentleman.  Everlean Bucher L 04/27/2015, 8:12 AM

## 2015-04-27 NOTE — Progress Notes (Signed)
PT Cancellation Note  Patient Details Name: Jerome Horton MRN: [id number] DOB: Oct 20, 1990   Cancelled Treatment:    Reason Eval/Treat Not Completed: PT screened, no needs identified, will sign off. Spoke with OT. Pt with no skilled PT needs at this time. PT SIGNING OFF. Please re-consult if needed in future.   Marcene Brawn 04/27/2015, 11:17 AM   Lewis Shock, PT, DPT Pager #: [phone] Office #: [phone]

## 2015-04-27 NOTE — Evaluation (Signed)
Occupational Therapy Evaluation Patient Details Name: Jerome Horton MRN: [id number] DOB: 07-28-1991 Today's Date: 04/27/2015    History of Present Illness 24 y.o. male s/p ORIF R wrist, open carpal tunnel release, Guyon's canal release, fasciotomy of R UE.    Clinical Impression   Pt eager to work with therapy, and has good recall of precautions. Pt requires set-up for ADLs due to dominant hand injury. Pt demonstrates ability to move fingers on R UE and denies sensation deficits. Pt will benefit from OT to increase safety and independence with ADLs prior to d/c.    Follow Up Recommendations  Outpatient OT;Supervision - Intermittent    Equipment Recommendations  None recommended by OT    Recommendations for Other Services       Precautions / Restrictions Precautions Precautions: None Required Braces or Orthoses: Other Brace/Splint Other Brace/Splint: elevate R UE in mission sling Restrictions Weight Bearing Restrictions: Yes RUE Weight Bearing: Non weight bearing      Mobility Bed Mobility Overal bed mobility: Modified Independent                Transfers Overall transfer level: Modified independent Equipment used: None                  Balance Overall balance assessment: No apparent balance deficits (not formally assessed)                                          ADL Overall ADL's : Needs assistance/impaired                                       General ADL Comments: Pt requires set-up for ADLs as injury is dominant side; education provided on keeping R UE elevated and dry.      Vision Vision Assessment?: No apparent visual deficits   Perception     Praxis      Pertinent Vitals/Pain Pain Assessment: 0-10 Pain Score: 8  Pain Location: R UE Pain Descriptors / Indicators: Sore Pain Intervention(s): Limited activity within patient's tolerance;Monitored during session;Premedicated before session;Repositioned      Hand Dominance Right   Extremity/Trunk Assessment Upper Extremity Assessment Upper Extremity Assessment: RUE deficits/detail RUE Deficits / Details: s/p ORIF R wrist RUE: Unable to fully assess due to immobilization RUE Coordination: decreased fine motor   Lower Extremity Assessment Lower Extremity Assessment: Overall WFL for tasks assessed   Cervical / Trunk Assessment Cervical / Trunk Assessment: Normal   Communication Communication Communication: No difficulties   Cognition Arousal/Alertness: Awake/alert Behavior During Therapy: WFL for tasks assessed/performed Overall Cognitive Status: Within Functional Limits for tasks assessed                     General Comments       Exercises       Shoulder Instructions      Home Living Family/patient expects to be discharged to:: Private residence Living Arrangements: Parent Available Help at Discharge: Family;Available PRN/intermittently Type of Home: House Home Access: Level entry     Home Layout: Two level Alternate Level Stairs-Number of Steps: 14 Alternate Level Stairs-Rails: Left Bathroom Shower/Tub: Tub/shower unit Shower/tub characteristics: Curtain       Home Equipment: None          Prior Functioning/Environment Level of Independence: Independent  OT Diagnosis: Acute pain   OT Problem List: Impaired UE functional use;Pain;Decreased knowledge of precautions   OT Treatment/Interventions: Self-care/ADL training;Therapeutic exercise;Therapeutic activities;Patient/family education    OT Goals(Current goals can be found in the care plan section) Acute Rehab OT Goals Patient Stated Goal: to visit with family OT Goal Formulation: With patient Time For Goal Achievement: 05/11/15 Potential to Achieve Goals: Good ADL Goals Pt Will Perform Upper Body Bathing: with set-up;sitting Pt Will Perform Lower Body Bathing: with set-up;sit to/from stand Pt Will Perform Upper Body  Dressing: with set-up;sitting Pt Will Perform Lower Body Dressing: with set-up;sit to/from stand  OT Frequency: Min 2X/week   Barriers to D/C:            Co-evaluation              End of Session Equipment Utilized During Treatment: Gait belt  Activity Tolerance: Patient tolerated treatment well;No increased pain Patient left: in bed;with call bell/phone within reach   Time: 1030-1046 OT Time Calculation (min): 16 min Charges:  OT General Charges $OT Visit: 1 Procedure OT Evaluation $Initial OT Evaluation Tier I: 1 Procedure G-Codes:    Marden Noble 04/27/2015, 11:18 AM

## 2015-04-27 NOTE — Progress Notes (Signed)
ANTIBIOTIC CONSULT NOTE - INITIAL  Pharmacy Consult for Vancomycin Indication: post-op prophylaxis  Allergies  Allergen Reactions  . Amoxicillin Anaphylaxis and Hives  . Penicillins Anaphylaxis and Hives    Patient Measurements: Height:  (167.6 cm) Weight: 150 lb (68.04 kg) IBW/kg (Calculated) : 63.8  Vital Signs: Temp: 98.9 F (37.2 C) (07/26 2339) Temp Source: Oral (07/26 2339) BP: 135/66 mmHg (07/26 2339) Pulse Rate: 75 (07/26 2339) Intake/Output from previous day: 07/26 0701 - 07/27 0700 In: 1200 [I.V.:1200] Out: 350 [Urine:350] Intake/Output from this shift: Total I/O In: 1200 [I.V.:1200] Out: 350 [Urine:350]  Labs: No results for input(s): WBC, HGB, PLT, LABCREA, CREATININE in the last 72 hours. CrCl cannot be calculated (Patient has no serum creatinine result on file.). No results for input(s): VANCOTROUGH, VANCOPEAK, VANCORANDOM, GENTTROUGH, GENTPEAK, GENTRANDOM, TOBRATROUGH, TOBRAPEAK, TOBRARND, AMIKACINPEAK, AMIKACINTROU, AMIKACIN in the last 72 hours.   Microbiology: No results found for this or any previous visit (from the past 720 hour(s)).  Medical History: History reviewed. No pertinent past medical history.  Medications:  No prescriptions prior to admission   Assessment: 24 y.o. male presents after crush injury to R hand and wrist. S/p hand surgery 7/26 p.m. Pt received pre-op vancomycin 1gm IV ~2000. To continue vancomycin post-op. No Scr this admission.  Goal of Therapy:  Vancomycin trough level 10-15 mcg/ml  Plan:  Vancomycin 1gm IV q12h Will f/u SCr and f/u with attending MD regarding length of post-op abx  Christoper Fabian, PharmD, BCPS Clinical pharmacist, pager [phone] 04/27/2015,2:51 AM

## 2015-04-27 NOTE — Op Note (Signed)
NAMENIZAR, CUTLER NO.:  [id number]  MEDICAL RECORD NO.:  [id number]  LOCATION:  6N26C                        FACILITY:  [facility]  PHYSICIAN:  Dionne Ano. Paulanthony Gleaves, M.D.DATE OF BIRTH:  1991/06/12  DATE OF PROCEDURE: DATE OF DISCHARGE:                              OPERATIVE REPORT   PREOPERATIVE DIAGNOSES:  Status post crushing injury to the right forearm with compartment syndrome, acute carpal tunnel syndrome, displaced distal radius fracture, third and fourth metacarpal fractures, displaced unstable hook of the hamate fracture, excessive hematoma and swelling about the forearm and hand region palmarly.  POSTOPERATIVE DIAGNOSES:  Status post crushing injury to the right forearm with compartment syndrome, acute carpal tunnel syndrome, displaced distal radius fracture, third and fourth metacarpal fractures, displaced unstable hook of the hamate fracture, excessive hematoma and swelling about the forearm and hand region palmarly.  PROCEDURES: 1. Fasciotomy, forearm, wrist, right upper extremity, deep and     superficial compartments were all released. 2. Open carpal tunnel release. 3. Open Guyon's canal release with ulnar nerve neurolysis. 4. Open reduction internal fixation, hook of the hamate fracture with     large metaphyseal flare portion attached to the hook utilizing an     Acutrak 20 mm screw. 5. Open reduction internal fixation of distal radius fracture with DVR     Crosslock plate and screw construct. 6. Closed treatment of ulnar styloid fracture. 7. Closed treatment of third and fourth metacarpal fractures which     were highly stable and would not move on intraoperative stressing     indicative of a somewhat incomplete fracture pattern. 8. AP, lateral, and oblique stress x-ray were performed, examined, and     interpreted by myself and Mr. Wynona Neat. 9. Application of a vacuum-assisted closure device, right forearm.  SURGEON:  Dionne Ano. Amanda Pea,  M.D.  ASSISTANT:  Karie Chimera, PA-C.  COMPLICATIONS:  None.  ANESTHESIA:  General.  TOURNIQUET TIME:  Less than 2 hours.  DRAINS:  Vacuum assisted closure device placed.  INDICATIONS:  This patient had an on-the-job injury and presented with an acute compartment syndrome and carpal tunnel symptomatology.  I have discussed the risks and benefits of surgery including risk of infection, bleeding, anesthesia, damage to normal structures, and failure of surgery to accomplish its intended goals of relieving symptoms and restoring function.  With this in mind, he desires to proceed.  All questions have been encouraged and answered preoperatively.  OPERATIVE PROCEDURE:  The patient was seen by myself and Anesthesia, taken to the operative suite, and underwent smooth induction of general anesthesia.  He was pre-scrubbed with Hibiclens, followed by a thorough Betadine scrub and paint.  Myself and Mr. Wynona Neat performed a scrub and paint.  Following this, outline marks were made visually.  Arm was elevated.  Time-out was called.  Preoperative vancomycin was given, and the tourniquet was insufflated.  At this point in time, I made an extended carpal tunnel release incision going up the arm to allow for fasciotomy and exposure of the radius.  I mainly encountered a large amount of hematoma blood. I could stick my finger down into the carpal floor and identify hook of the hamate fracture  which was rather impressive and had a large metaphyseal flare associated with it.  At this time, I knew quite clearly we would have to release all the areas.  Thus, we extended the incision and performed a deep and superficial fasciotomy of the volar forearm.  This was done without difficulty.  A large amount of hematoma was evacuated.  Following this, we then performed an open carpal tunnel release under 4.0 loupe magnification just off the hook of the hamate.  A portion of the proximal edge had  been avulsed with the trauma already.  There was a large amount of hematoma blood here.  After carpal tunnel release, I then performed a Guyon's canal release without difficulty.  This was performed utilizing a facial nerve dissector, Therapist, nutritional, and a combination of knife and rhytidectomy scissors.  Once this was complete, we took intraoperative photos, identified visually the hook of the hamate fracture with metaphyseal flare.  Our literature would support hook of the hamate excision; however, there was a 50% or more of the fourth and fifth metacarpal head associated with the metaphyseal flare, and I felt this created an unstable void and thus certainly, hook of the hamate excision would be unwise and certainly intraoperative recommended attempted fixation.  The fracture was treated, debrided, and following this, reduced to my satisfaction. Once this was accomplished, we then placed the guidewire for the Acutrak screw followed by placement of a drill bit overlying this and followed by placement of a 20 mm Acutrak screw.  This allowed for coaptation of cancellous and cortical cancellous surfaces under compression and achieve firm fixation.  There was no screws protruding.  Our literature would also support this hook of the hamate metaphyseal flare fractures can cause flexor tendon disruption, especially the small finger; and I made sure there was no sharp points, bony voids, or other problems.  All was smooth as silk, and I was quite pleased with this. At this point in time, we then irrigated copiously.  We noted how soft the thenar and hypothenar regions were and were quite pleased to see this.  We felt that the bulk of the swelling was due to the aforementioned blood contents in the carpal canal.  Following this, we identified the median nerve proximally in the palmar cutaneous branch and kept this carefully protected.  We then made an interval between the carpal canal  contents in the FCR and exposed the already visualized fracture about the radius.  The remaining portions of the pronator quadratus which were not avulsed off were then very carefully teased from radial to ulna.  Provisional fixation with K-wire was accomplished followed by application of a DVR plate and screw construct.  The patient appeared to have an old type deformity to the radius, likely an old childhood fracture, etc.; and thus, I prebent the plate to contour to his anatomy which was slightly different than the usual metaphyseal flare, which he firmed, sound fixation, and we were able to recreate his geometry including volar height inclination and volar tilt. We replaced this in the findings.  Following this, we irrigated copiously, deflated the tourniquet.  At this juncture, all looked quite well.  Final x-rays were taken of the hardware and the construct.  At this time, we performed a very aggressive stress x-ray of the third and fourth metacarpal fractures. The fractures appeared almost to be incomplete and had no movement despite aggressive intraoperative motion.  Thus, we treated the third and fourth metacarpal fractures, mid shaft closed  as with firm PIP, DIP, and MCP motion, it did not move whatsoever.  Certainly, in the interest of not over operating, we left this alone.  At this time, we then checked the compartments once again.  The compartments looked very good, very soft about the thenar, hypothenar, and first dorsal interosseous regions.  At this time, we placed a vacuum-assisted closure device and made sure the nerve was carefully protected with one far-near-far stitch in the area overlying the carpal tunnel so that the nerve would not be encroached upon with the vacuum-assisted closure device and apparatus. VAC was hooked up to 125 mmHg.  A good seal was noted.  Splint was placed.  Refill was excellent.  Tourniquet time was less than 90 minutes, and all looked  quite well.  I discussed all issues with his mother and family.  This is going to be a long and arduous course to the patient in terms of recovery process.  Unfortunately, at his young age, we need to work really hard to decrease his disability and problems into the future.  It has been a pleasure to see this nice gentleman with a very unfortunate and outstanding crushing injury tonight.  Should any problems occur, we will be immediately available.  I have discussed with the patient, the in's and out's, do's and don'ts, various imponderables, and other aspects of his care.  He will need a return trip to the operative theater for repeat I and D, possible closure.  He understands this.     Dionne Ano. Amanda Pea, M.D.     [facility]  D:  04/26/2015  T:  04/27/2015  Job:  [id number]

## 2015-04-27 NOTE — Care Management Note (Signed)
Case Management Note  Patient Details  Name: Marvie Calender MRN: [id number] Date of Birth: Jun 25, 1991  Subjective/Objective:                    Action/Plan: Called [employer] [phone] to receive name of workers comp Sports coach , was told claim has not been filed as of yet . Called patient's work number on face sheet unsuccessful . Spoke to patient . His mom is bringing in his cell phone today which has his boss' phone number programed in. Will check back with patient later today .   Per note plan to return to OR . VAC application placed on chart . MD if home VAC needed please sign application , will also need measurements . Thanks   Expected Discharge Date:                  Expected Discharge Plan:  Home/Self Care  In-House Referral:     Discharge planning Services     Post Acute Care Choice:    Choice offered to:     DME Arranged:    DME Agency:     HH Arranged:    HH Agency:     Status of Service:  In process, will continue to follow  Medicare Important Message Given:    Date Medicare IM Given:    Medicare IM give by:    Date Additional Medicare IM Given:    Additional Medicare Important Message give by:     If discussed at Long Length of Stay Meetings, dates discussed:    Additional Comments:  Kingsley Plan, RN 04/27/2015, 11:37 AM

## 2015-04-28 ENCOUNTER — Encounter (HOSPITAL_COMMUNITY)
Admission: EM | Disposition: A | Discharge status: Home Health | Payer: Self-pay | Source: Home / Self Care | Attending: Orthopedic Surgery

## 2015-04-28 SURGERY — IRRIGATION AND DEBRIDEMENT EXTREMITY
Anesthesia: General | Site: Hand | Laterality: Right

## 2015-04-28 MED ORDER — PROPOFOL 10 MG/ML IV BOLUS
INTRAVENOUS | Status: AC
  Filled 2015-04-28: qty 20

## 2015-04-28 MED ORDER — IBUPROFEN 800 MG PO TABS
800.0000 mg | ORAL_TABLET | Freq: Once | ORAL | Status: AC
  Administered 2015-04-28: 800 mg via ORAL
  Filled 2015-04-28: qty 1

## 2015-04-28 MED ORDER — ACETAMINOPHEN 500 MG PO TABS
ORAL_TABLET | ORAL | Status: AC
  Filled 2015-04-28: qty 3

## 2015-04-28 MED ORDER — DEXAMETHASONE SODIUM PHOSPHATE 4 MG/ML IJ SOLN
INTRAMUSCULAR | Status: AC
  Filled 2015-04-28: qty 2

## 2015-04-28 MED ORDER — PHENYLEPHRINE 40 MCG/ML (10ML) SYRINGE FOR IV PUSH (FOR BLOOD PRESSURE SUPPORT)
PREFILLED_SYRINGE | INTRAVENOUS | Status: AC
  Filled 2015-04-28: qty 10

## 2015-04-28 MED ORDER — SUCCINYLCHOLINE CHLORIDE 20 MG/ML IJ SOLN
INTRAMUSCULAR | Status: AC
  Filled 2015-04-28: qty 1

## 2015-04-28 MED ORDER — KETOROLAC TROMETHAMINE 30 MG/ML IJ SOLN
INTRAMUSCULAR | Status: AC
  Filled 2015-04-28: qty 1

## 2015-04-28 MED ORDER — VANCOMYCIN HCL IN DEXTROSE 1-5 GM/200ML-% IV SOLN
1000.0000 mg | INTRAVENOUS | Status: AC
  Administered 2015-04-29: 1000 mg via INTRAVENOUS
  Filled 2015-04-28: qty 200

## 2015-04-28 MED ORDER — CHLORHEXIDINE GLUCONATE 4 % EX LIQD
60.0000 mL | Freq: Once | CUTANEOUS | Status: DC
  Filled 2015-04-28: qty 60

## 2015-04-28 MED ORDER — LIDOCAINE HCL (CARDIAC) 20 MG/ML IV SOLN
INTRAVENOUS | Status: AC
  Filled 2015-04-28: qty 5

## 2015-04-28 MED ORDER — EPHEDRINE SULFATE 50 MG/ML IJ SOLN
INTRAMUSCULAR | Status: AC
  Filled 2015-04-28: qty 1

## 2015-04-28 MED ORDER — MIDAZOLAM HCL 2 MG/2ML IJ SOLN
INTRAMUSCULAR | Status: AC
  Filled 2015-04-28: qty 2

## 2015-04-28 MED ORDER — FENTANYL CITRATE (PF) 250 MCG/5ML IJ SOLN
INTRAMUSCULAR | Status: AC
  Filled 2015-04-28: qty 5

## 2015-04-28 NOTE — Care Management (Signed)
Received call from Workers Comp Adjuster   [employer] [phone]  Fax [phone]

## 2015-04-28 NOTE — Consult Note (Signed)
  Subjective: The patient is awake, his family members are in the room. His case unfortunately was canceled today as he ate a cheeseburger earlier. He states he was unaware of his nothing by mouth status. Main complaint is numbness about the right thumb otherwise his pain is controlled. He denies nausea, vomiting, fever or chills. Denies severe pain about the upper extremity. Objective:  Vital signs are stable he is afebrile HEENT: Atraumatic normocephalic Chest: Equal expansions are present, respirations are nonlabored Abdomen: Nontender Right upper extremity: Splint is clean and intact, he has good early digital range of motion, refill is intact gross sensation is intact. Active and passive flexion extension of the digits are intact without significant pain no signs of compartment syndrome are present Assessment: Status post crush injury to the right hand secondary to an on-the-job injury resulting in an evolving compartment syndrome fractures of the right third and fourth metacarpal, hamate fracture, distal radius fracture and ulna fracture Status post open reduction internal fixation about the right hamate and right distal radius Plan: Have a lengthy discussion with he and his family in regards to the nature of his injury the time frames of recovery and the severity of the crush injury. We have discussed with him our plans will be to return to the operative suite tomorrow to try to primarily close the wound. Therefore see is the potential that a split-thickness skin graft may need to be obtained. We'll continue the wound VAC. I have discussed with his nurse he is in PO status, and the events surrounding the mishap today. Apparently, orders which were placed yesterday were not released and this was not related at shift change. Thus, I have discussed this with his attending nurse who ensures that he will be nothing by mouth after midnight tonight. All questions were encouraged and answered.  Arlys John L.  Wynona Neat, PA-C

## 2015-04-28 NOTE — Care Management Note (Addendum)
Case Management Note  Patient Details  Name: Jerome Horton MRN: [id number] Date of Birth: 27-Jul-1991  Subjective/Objective:                    Action/Plan:  Victorino Dike returned call, she has submitted all information to [employer] today , she instructed NCM to call [employer] [phone] tomorrow afternoon , by than case worker will be assigned.     OT recommending outpatient OT , MD / PA if you agree please sign referral form on shadow chart . Thanks Ronny Flurry RN BSN [phone]    Patient provided following information :   Policy [id number] acct [id number]   Call Jennifer at [phone] . Called and left message for [facility] awaiting call back.    Called [employer] again , told no claim filed on patient. Spoke with patient who provided his general manager's name and number Sophia Gorden [phone] . Called and left voice mail , awaiting call back. Ronny Flurry RN BSN [phone]   Expected Discharge Date:                  Expected Discharge Plan:  Home/Self Care  In-House Referral:     Discharge planning Services     Post Acute Care Choice:    Choice offered to:     DME Arranged:    DME Agency:     HH Arranged:    HH Agency:     Status of Service:  In process, will continue to follow  Medicare Important Message Given:    Date Medicare IM Given:    Medicare IM give by:    Date Additional Medicare IM Given:    Additional Medicare Important Message give by:     If discussed at Long Length of Stay Meetings, dates discussed:    Additional Comments:  Kingsley Plan, RN 04/28/2015, 11:21 AM

## 2015-04-29 ENCOUNTER — Inpatient Hospital Stay (HOSPITAL_COMMUNITY): Payer: Worker's Compensation | Admitting: Certified Registered Nurse Anesthetist

## 2015-04-29 ENCOUNTER — Encounter (HOSPITAL_COMMUNITY)
Admission: EM | Disposition: A | Discharge status: Home Health | Payer: Managed Care, Other (non HMO) | Source: Home / Self Care | Attending: Orthopedic Surgery

## 2015-04-29 HISTORY — PX: I & D EXTREMITY: SHX5045

## 2015-04-29 LAB — SURGICAL PCR SCREEN
MRSA, PCR: NEGATIVE
STAPHYLOCOCCUS AUREUS: NEGATIVE

## 2015-04-29 SURGERY — IRRIGATION AND DEBRIDEMENT EXTREMITY
Anesthesia: General | Site: Arm Lower | Laterality: Right

## 2015-04-29 MED ORDER — LIDOCAINE HCL (CARDIAC) 20 MG/ML IV SOLN
INTRAVENOUS | Status: AC
  Filled 2015-04-29: qty 5

## 2015-04-29 MED ORDER — MINERAL OIL LIGHT 100 % EX OIL
TOPICAL_OIL | CUTANEOUS | Status: AC
  Filled 2015-04-29: qty 25

## 2015-04-29 MED ORDER — VANCOMYCIN HCL IN DEXTROSE 1-5 GM/200ML-% IV SOLN
1000.0000 mg | Freq: Two times a day (BID) | INTRAVENOUS | Status: DC
  Administered 2015-04-30 – 2015-05-01 (×3): 1000 mg via INTRAVENOUS
  Filled 2015-04-29 (×5): qty 200

## 2015-04-29 MED ORDER — MIDAZOLAM HCL 2 MG/2ML IJ SOLN
INTRAMUSCULAR | Status: AC
  Filled 2015-04-29: qty 2

## 2015-04-29 MED ORDER — BUPIVACAINE-EPINEPHRINE 0.25% -1:200000 IJ SOLN
INTRAMUSCULAR | Status: DC | PRN
  Administered 2015-04-29: 30 mL

## 2015-04-29 MED ORDER — PROPOFOL 10 MG/ML IV BOLUS
INTRAVENOUS | Status: AC
  Filled 2015-04-29: qty 20

## 2015-04-29 MED ORDER — MINERAL OIL LIGHT 100 % EX OIL
TOPICAL_OIL | CUTANEOUS | Status: DC | PRN
  Administered 2015-04-29: 1 via TOPICAL

## 2015-04-29 MED ORDER — LACTATED RINGERS IV SOLN
INTRAVENOUS | Status: DC
  Administered 2015-04-29: 15:00:00 via INTRAVENOUS

## 2015-04-29 MED ORDER — MEPERIDINE HCL 25 MG/ML IJ SOLN: 6.2500 mg | INTRAMUSCULAR | Status: DC | PRN

## 2015-04-29 MED ORDER — PROPOFOL 10 MG/ML IV BOLUS
INTRAVENOUS | Status: DC | PRN
  Administered 2015-04-29: 170 mg via INTRAVENOUS

## 2015-04-29 MED ORDER — ONDANSETRON HCL 4 MG/2ML IJ SOLN
INTRAMUSCULAR | Status: AC
  Filled 2015-04-29: qty 2

## 2015-04-29 MED ORDER — OXYCODONE HCL 5 MG/5ML PO SOLN: 5.0000 mg | Freq: Once | ORAL | Status: DC | PRN

## 2015-04-29 MED ORDER — MIDAZOLAM HCL 5 MG/5ML IJ SOLN
INTRAMUSCULAR | Status: DC | PRN
  Administered 2015-04-29: 2 mg via INTRAVENOUS

## 2015-04-29 MED ORDER — FENTANYL CITRATE (PF) 250 MCG/5ML IJ SOLN
INTRAMUSCULAR | Status: AC
  Filled 2015-04-29: qty 5

## 2015-04-29 MED ORDER — HYDROMORPHONE HCL 1 MG/ML IJ SOLN: 0.2500 mg | INTRAMUSCULAR | Status: DC | PRN

## 2015-04-29 MED ORDER — BUPIVACAINE-EPINEPHRINE (PF) 0.25% -1:200000 IJ SOLN
INTRAMUSCULAR | Status: AC
  Filled 2015-04-29: qty 30

## 2015-04-29 MED ORDER — LACTATED RINGERS IV SOLN
INTRAVENOUS | Status: DC | PRN
  Administered 2015-04-29 (×2): via INTRAVENOUS

## 2015-04-29 MED ORDER — LIDOCAINE HCL (CARDIAC) 20 MG/ML IV SOLN
INTRAVENOUS | Status: DC | PRN
  Administered 2015-04-29: 40 mg via INTRAVENOUS

## 2015-04-29 MED ORDER — DEXAMETHASONE SODIUM PHOSPHATE 10 MG/ML IJ SOLN
INTRAMUSCULAR | Status: DC | PRN
  Administered 2015-04-29: 10 mg via INTRAVENOUS

## 2015-04-29 MED ORDER — DEXAMETHASONE SODIUM PHOSPHATE 10 MG/ML IJ SOLN
INTRAMUSCULAR | Status: AC
  Filled 2015-04-29: qty 1

## 2015-04-29 MED ORDER — FENTANYL CITRATE (PF) 100 MCG/2ML IJ SOLN
INTRAMUSCULAR | Status: DC | PRN
  Administered 2015-04-29: 100 ug via INTRAVENOUS
  Administered 2015-04-29 (×2): 25 ug via INTRAVENOUS
  Administered 2015-04-29: 50 ug via INTRAVENOUS
  Administered 2015-04-29 (×2): 25 ug via INTRAVENOUS

## 2015-04-29 MED ORDER — OXYCODONE HCL 5 MG PO TABS: 5.0000 mg | ORAL_TABLET | Freq: Once | ORAL | Status: DC | PRN

## 2015-04-29 MED ORDER — SODIUM CHLORIDE 0.9 % IR SOLN
Status: DC | PRN
  Administered 2015-04-29: 3000 mL

## 2015-04-29 MED ORDER — 0.9 % SODIUM CHLORIDE (POUR BTL) OPTIME
TOPICAL | Status: DC | PRN
  Administered 2015-04-29: 1000 mL

## 2015-04-29 SURGICAL SUPPLY — 53 items
BANDAGE ELASTIC 3 VELCRO ST LF (GAUZE/BANDAGES/DRESSINGS) ×3 IMPLANT
BANDAGE ELASTIC 4 VELCRO ST LF (GAUZE/BANDAGES/DRESSINGS) ×3 IMPLANT
BANDAGE ELASTIC 6 VELCRO ST LF (GAUZE/BANDAGES/DRESSINGS) ×3 IMPLANT
BNDG CONFORM 2 STRL LF (GAUZE/BANDAGES/DRESSINGS) IMPLANT
BNDG GAUZE ELAST 4 BULKY (GAUZE/BANDAGES/DRESSINGS) ×6 IMPLANT
BUR LG DIAMOND 1.0MM (BURR) ×3 IMPLANT
CANISTER SUCTION 2500CC (MISCELLANEOUS) ×6 IMPLANT
CORDS BIPOLAR (ELECTRODE) ×3 IMPLANT
COTTONBALL LRG STERILE PKG (GAUZE/BANDAGES/DRESSINGS) ×15 IMPLANT
CUFF TOURNIQUET SINGLE 18IN (TOURNIQUET CUFF) ×3 IMPLANT
CUFF TOURNIQUET SINGLE 24IN (TOURNIQUET CUFF) IMPLANT
DRSG ADAPTIC 3X8 NADH LF (GAUZE/BANDAGES/DRESSINGS) ×6 IMPLANT
DRSG MEPITEL 4X7.2 (GAUZE/BANDAGES/DRESSINGS) ×9 IMPLANT
DRSG PAD ABDOMINAL 8X10 ST (GAUZE/BANDAGES/DRESSINGS) ×9 IMPLANT
ELECT REM PT RETURN 9FT ADLT (ELECTROSURGICAL)
ELECTRODE REM PT RTRN 9FT ADLT (ELECTROSURGICAL) IMPLANT
GAUZE SPONGE 4X4 12PLY STRL (GAUZE/BANDAGES/DRESSINGS) ×9 IMPLANT
GAUZE XEROFORM 1X8 LF (GAUZE/BANDAGES/DRESSINGS) IMPLANT
GAUZE XEROFORM 5X9 LF (GAUZE/BANDAGES/DRESSINGS) ×6 IMPLANT
GLOVE BIOGEL M STRL SZ7.5 (GLOVE) ×3 IMPLANT
GLOVE SS BIOGEL STRL SZ 8 (GLOVE) ×1 IMPLANT
GLOVE SUPERSENSE BIOGEL SZ 8 (GLOVE) ×2
GOWN STRL REUS W/ TWL LRG LVL3 (GOWN DISPOSABLE) ×3 IMPLANT
GOWN STRL REUS W/ TWL XL LVL3 (GOWN DISPOSABLE) ×3 IMPLANT
GOWN STRL REUS W/TWL LRG LVL3 (GOWN DISPOSABLE) ×6
GOWN STRL REUS W/TWL XL LVL3 (GOWN DISPOSABLE) ×6
HANDPIECE INTERPULSE COAX TIP (DISPOSABLE)
KIT BASIN OR (CUSTOM PROCEDURE TRAY) ×3 IMPLANT
KIT ROOM TURNOVER OR (KITS) ×3 IMPLANT
MANIFOLD NEPTUNE II (INSTRUMENTS) IMPLANT
NEEDLE HYPO 25GX1X1/2 BEV (NEEDLE) IMPLANT
NS IRRIG 1000ML POUR BTL (IV SOLUTION) ×3 IMPLANT
PACK ORTHO EXTREMITY (CUSTOM PROCEDURE TRAY) ×3 IMPLANT
PAD ARMBOARD 7.5X6 YLW CONV (MISCELLANEOUS) ×6 IMPLANT
PAD CAST 3X4 CTTN HI CHSV (CAST SUPPLIES) ×1 IMPLANT
PAD CAST 4YDX4 CTTN HI CHSV (CAST SUPPLIES) ×1 IMPLANT
PADDING CAST COTTON 3X4 STRL (CAST SUPPLIES) ×2
PADDING CAST COTTON 4X4 STRL (CAST SUPPLIES) ×2
SET HNDPC FAN SPRY TIP SCT (DISPOSABLE) IMPLANT
SPLINT FIBERGLASS 3X35 (CAST SUPPLIES) ×3 IMPLANT
SPONGE LAP 18X18 X RAY DECT (DISPOSABLE) ×3 IMPLANT
SPONGE LAP 4X18 X RAY DECT (DISPOSABLE) ×3 IMPLANT
SUT CHROMIC 5 0 P 3 (SUTURE) ×3 IMPLANT
SUT PROLENE 4 0 P 3 18 (SUTURE) ×18 IMPLANT
SYR CONTROL 10ML LL (SYRINGE) IMPLANT
SYSTEM CHEST DRAIN TLS 7FR (DRAIN) ×3 IMPLANT
TOWEL OR 17X24 6PK STRL BLUE (TOWEL DISPOSABLE) ×3 IMPLANT
TOWEL OR 17X26 10 PK STRL BLUE (TOWEL DISPOSABLE) ×3 IMPLANT
TUBE ANAEROBIC SPECIMEN COL (MISCELLANEOUS) IMPLANT
TUBE CONNECTING 12'X1/4 (SUCTIONS) ×1
TUBE CONNECTING 12X1/4 (SUCTIONS) ×2 IMPLANT
WATER STERILE IRR 1000ML POUR (IV SOLUTION) IMPLANT
YANKAUER SUCT BULB TIP NO VENT (SUCTIONS) ×3 IMPLANT

## 2015-04-29 NOTE — Progress Notes (Signed)
Orthopedic Tech Progress Note Patient Details:  Jerome Horton 1991-02-26 [id number] Placed RUE in arm elevator sling. Ortho Devices Type of Ortho Device: Arm sling Ortho Device/Splint Location: RUE Ortho Device/Splint Interventions: Application   [name] 04/29/2015, 10:08 PM

## 2015-04-29 NOTE — Anesthesia Preprocedure Evaluation (Addendum)
Anesthesia Evaluation  Patient identified by MRN, date of birth, ID band Patient awake    Reviewed: Allergy & Precautions, NPO status , Patient's Chart, lab work & pertinent test results  Airway Mallampati: II  TM Distance: >3 FB Neck ROM: Full    Dental no notable dental hx.    Pulmonary neg pulmonary ROS,  breath sounds clear to auscultation  Pulmonary exam normal       Cardiovascular negative cardio ROS Normal cardiovascular examRhythm:Regular Rate:Normal     Neuro/Psych negative neurological ROS  negative psych ROS   GI/Hepatic negative GI ROS, Neg liver ROS,   Endo/Other  negative endocrine ROS  Renal/GU negative Renal ROS  negative genitourinary   Musculoskeletal negative musculoskeletal ROS (+)   Abdominal   Peds negative pediatric ROS (+)  Hematology negative hematology ROS (+)   Anesthesia Other Findings   Reproductive/Obstetrics negative OB ROS                             Anesthesia Physical  Anesthesia Plan  ASA: I and emergent  Anesthesia Plan: General   Post-op Pain Management:    Induction: Intravenous  Airway Management Planned: LMA  Additional Equipment:   Intra-op Plan:   Post-operative Plan: Extubation in OR  Informed Consent: I have reviewed the patients History and Physical, chart, labs and discussed the procedure including the risks, benefits and alternatives for the proposed anesthesia with the patient or authorized representative who has indicated his/her understanding and acceptance.   Dental advisory given  Plan Discussed with: CRNA  Anesthesia Plan Comments:        Anesthesia Quick Evaluation

## 2015-04-29 NOTE — Transfer of Care (Signed)
Immediate Anesthesia Transfer of Care Note  Patient: Jerome Horton  Procedure(s) Performed: Procedure(s): IRRIGATION AND DEBRIDEMENT RIGHT HAND,  FOREARM AND POSSIBLE CLOSURE (Right)  Patient Location: PACU  Anesthesia Type:General  Level of Consciousness: sedated  Airway & Oxygen Therapy: Patient Spontanous Breathing and Patient connected to nasal cannula oxygen  Post-op Assessment: Report given to RN and Post -op Vital signs reviewed and stable  Post vital signs: stable  Last Vitals:  Filed Vitals:   04/29/15 1403  BP: 128/63  Pulse: 72  Temp: 37.2 C  Resp: 18    Complications: No apparent anesthesia complications

## 2015-04-29 NOTE — Anesthesia Procedure Notes (Signed)
Procedure Name: LMA Insertion Date/Time: 04/29/2015 5:00 PM Performed by: Fanny Dance L Pre-anesthesia Checklist: Patient identified, Emergency Drugs available, Suction available and Patient being monitored Patient Re-evaluated:Patient Re-evaluated prior to inductionOxygen Delivery Method: Circle system utilized Preoxygenation: Pre-oxygenation with 100% oxygen Intubation Type: IV induction Ventilation: Mask ventilation without difficulty LMA: LMA inserted LMA Size: 4.0 Tube size: 4.0 mm Number of attempts: 1 Placement Confirmation: positive ETCO2 and breath sounds checked- equal and bilateral Tube secured with: Tape Dental Injury: Teeth and Oropharynx as per pre-operative assessment

## 2015-04-29 NOTE — Anesthesia Postprocedure Evaluation (Signed)
  Anesthesia Post-op Note  Patient: Jerome Horton  Procedure(s) Performed: Procedure(s): IRRIGATION AND DEBRIDEMENT RIGHT HAND,  FOREARM WITH PARTIAL CLOSURE AND SPLIT THICKNESS SKIN GRAFTING FROM RIGHT ANTEROLATERAL THIGH DONOR SITE (Right)  Patient Location: PACU  Anesthesia Type: General   Level of Consciousness: awake, alert  and oriented  Airway and Oxygen Therapy: Patient Spontanous Breathing  Post-op Pain: mild  Post-op Assessment: Post-op Vital signs reviewed  Post-op Vital Signs: Reviewed  Last Vitals:  Filed Vitals:   04/29/15 1939  BP: 135/64  Pulse: 65  Temp: 36.8 C  Resp: 9    Complications: No apparent anesthesia complications

## 2015-04-29 NOTE — Care Management Note (Signed)
Case Management Note  Patient Details  Name: Jerome Horton MRN: [id number] Date of Birth: July 19, 1991  Subjective/Objective:                    Action/Plan: Clifton Custard NCM with Tedd Sias .  Phone [phone] , fax [phone]   [email or web address]   DME and home health direct at [phone]  for any dc needs    Expected Discharge Date:                  Expected Discharge Plan:  Home w Home Health Services  In-House Referral:     Discharge planning Services     Post Acute Care Choice:    Choice offered to:     DME Arranged:    DME Agency:     HH Arranged:    HH Agency:     Status of Service:  In process, will continue to follow  Medicare Important Message Given:    Date Medicare IM Given:    Medicare IM give by:    Date Additional Medicare IM Given:    Additional Medicare Important Message give by:     If discussed at Long Length of Stay Meetings, dates discussed:    Additional Comments:  Kingsley Plan, RN 04/29/2015, 3:46 PM

## 2015-04-29 NOTE — H&P (Signed)
  Patient seen in holding area.  Patient's doing fairly well.  We'll plan for I & D and the attempted wound closure and fluoroscopy exam with repair reconstruction as necessary  Yue Glasheen M.D.

## 2015-04-29 NOTE — OR Nursing (Signed)
Ortho tech notified patient needs mission sling.

## 2015-04-29 NOTE — Op Note (Signed)
See dictation#[id number] Amanda Pea MD

## 2015-04-30 NOTE — Progress Notes (Signed)
Orthopedic Tech Progress Note Patient Details:  Jerome Horton [birth year] [id number]  Ortho Devices Type of Ortho Device: Arm sling Ortho Device/Splint Location: RUE Ortho Device/Splint Interventions: Application   Cammer, Mickie Bail 04/30/2015, 9:30 AM

## 2015-04-30 NOTE — Progress Notes (Signed)
Occupational Therapy Treatment and Discharge Patient Details Name: Jerome Horton MRN: [id number] DOB: 1991/07/06 Today's Date: 04/30/2015    History of present illness 24 y.o. male s/p ORIF R wrist, open carpal tunnel release, Guyon's canal release, fasciotomy of R UE. 04/29/15 I&D of right forearm, Palmaris longus tenotomy and resection, Median nerve neurolysis, skin graft with donor site from the right thigh.   OT comments  This 25 yo male admitted and underwent above presents to acute OT with all education completed and pt aware of what he needs to be doing. No further acute OT needs. Follow up therapy per Dr. Amedeo Plenty. Acute OT will sign off.  Follow Up Recommendations   (per Dr. Sheran Spine)    Equipment Recommendations  None recommended by OT       Precautions / Restrictions Precautions Precautions: None Required Braces or Orthoses: Other Brace/Splint Other Brace/Splint: elevate R UE in mission sling Restrictions Weight Bearing Restrictions: Yes RUE Weight Bearing: Non weight bearing       Mobility Bed Mobility Overal bed mobility: Independent                Transfers Overall transfer level: Independent               General transfer comment: pt up and walking the hallways on his own        ADL                                         General ADL Comments: Pt reports he has been doing all of his BADLs with only A that he has asked for is for someone to wash his back.       Vision                 Additional Comments: No change from baseline          Cognition   Behavior During Therapy: WFL for tasks assessed/performed Overall Cognitive Status: Within Functional Limits for tasks assessed                         Exercises Other Exercises Other Exercises: Pt moving his fingers WNL for within constraints of castingl; elbow and shouder WNL. He is aware he needs to keep his arm elevated (however when entered the room it was  not as he was in bed). He did have ice bags around it.           Pertinent Vitals/ Pain       Pain Assessment: No/denies pain Pain Score: 0-No pain            Progress Toward Goals  OT Goals(current goals can now be found in the care plan section)  Progress towards OT goals: Goals met/education completed, patient discharged from [facility] Discharge plan remains appropriate       End of Session Equipment Utilized During Treatment:  (none)   Activity Tolerance Patient tolerated treatment well   Patient Left in bed   Nurse Communication          Time: 7425-9563 OT Time Calculation (min): 9 min  Charges: OT General Charges $OT Visit: 1 Procedure OT Treatments $Therapeutic Exercise: 8-22 mins  Jerome Horton [phone] 04/30/2015, 11:39 AM

## 2015-04-30 NOTE — Progress Notes (Signed)
Subjective: 1 Day Post-Op Procedure(s) (LRB): IRRIGATION AND DEBRIDEMENT RIGHT HAND,  FOREARM WITH PARTIAL CLOSURE AND SPLIT THICKNESS SKIN GRAFTING FROM RIGHT ANTEROLATERAL THIGH DONOR SITE (Right) Patient reports pain as mild.   I have discussed with the patient his operation and the plans for the future.  He is tolerating his diet and moving his fingers beautifully today  He denies new complaints. He is in excellent spirits. Objective: Vital signs in last 24 hours: Temp:  [97.9 F (36.6 C)-99 F (37.2 C)] 97.9 F (36.6 C) (07/30 0601) Pulse Rate:  [62-87] 71 (07/30 0601) Resp:  [7-18] 16 (07/30 0601) BP: (116-135)/(55-81) 128/55 mmHg (07/30 0601) SpO2:  [96 %-100 %] 98 % (07/30 0601)  Intake/Output from previous day: 07/29 0701 - 07/30 0700 In: 2342.5 [P.O.:240; I.V.:1702.5; IV Piggyback:400] Out: 475 [Urine:400; Drains:50; Blood:25] Intake/Output this shift:    No results for input(s): HGB in the last 72 hours. No results for input(s): WBC, RBC, HCT, PLT in the last 72 hours. No results for input(s): NA, K, CL, CO2, BUN, CREATININE, GLUCOSE, CALCIUM in the last 72 hours. No results for input(s): LABPT, INR in the last 72 hours. Physical exam: He is moving his fingers with ease. He has no signs of compartment syndrome. He looks very good today. He has no comp caring features. I removed the bandage on his thigh and instructed him on fan/dryer treatments to the area with nursing staff at bedside.  We will perform drying applications 3 times a day and then covered the thigh. Neurologically intact ABD soft No cellulitis present Compartment soft  Assessment/Plan: 1 Day Post-Op Procedure(s) (LRB): IRRIGATION AND DEBRIDEMENT RIGHT HAND,  FOREARM WITH PARTIAL CLOSURE AND SPLIT THICKNESS SKIN GRAFTING FROM RIGHT ANTEROLATERAL THIGH DONOR SITE (Right) Plan for discharge tomorrow   We will plan for ambulation today and dryer treatments to the thigh. We will continue elevation and  motion and antibiotics.  We will tentatively DC the patient tomorrow with precautions and follow-up in 10 days  Overall think he looks very well and pleased with his progress.  I discussed his findings and our plans for today with the nursing staff at bedside. We will mobilize him with hallway walking  All questions have been encouraged and answered.  I do feel that he has a long road ahead of him that I do feel that his positive attitude and young age will contribute substantially to his improvement  Aron Baba M 04/30/2015, 8:30 AM

## 2015-04-30 NOTE — Op Note (Signed)
NAMEAVEDIS, BEVIS NO.:  [id number]  MEDICAL RECORD NO.:  [id number]  LOCATION:  [id number]                        FACILITY:  [facility]  PHYSICIAN:  Dionne Ano. Aloni Chuang, M.D.DATE OF BIRTH:  03-27-1991  DATE OF PROCEDURE: DATE OF DISCHARGE:                              OPERATIVE REPORT   PREOPERATIVE DIAGNOSES:  Status post crush injury to the right forearm and hand with third and fourth metacarpal fractures; distal radius fracture and ulnar styloid fracture, status post open reduction and internal fixation of the distal radius; hamate fracture, unstable, status post open reduction and internal fixation; compartmental releases and carpal tunnel release, performed at initial operative setting.  The patient presents for incision and drainage, possible closure versus skin grafting.  POSTOPERATIVE DIAGNOSES:  Status post crush injury to the right forearm and hand with third and fourth metacarpal fractures; distal radius fracture and ulnar styloid fracture, status post open reduction and internal fixation of the distal radius; hamate fracture, unstable, status post open reduction and internal fixation; compartmental releases and carpal tunnel release, performed at initial operative setting.  The patient presents for incision and drainage, possible closure versus skin grafting.  PROCEDURES: 1. Irrigation and debridement of skin, subcutaneous tissue, muscle,     and tendinous tissue. 2. Palmaris longus tenotomy and resection. 3. Median nerve neurolysis, extensive in nature. 4. Split-thickness skin graft 10 x 5 cm defect with donor site     obtained from the right thigh. 5. Preparation of skin grafting site in the right volar forearm. 6. AP, lateral and oblique x-rays performed, examined, and interpreted     by myself.  SURGEON:  Dionne Ano. Amanda Pea, M.D.  ASSISTANT:  None.  COMPLICATIONS:  None.  ANESTHESIA:  General.  TOURNIQUET TIME:  Zero.  INDICATIONS:  This  patient is a 24 year old male, presents with the above-mentioned diagnosis.  I have counseled with him in regard to risks and benefits of surgery including risk of infection, bleeding, anesthesia, damage to normal structures, and failure of the surgery to accomplish its intended goals of relieving symptoms and restoring function.  With this in mind, he desires to proceed.  All questions have been encouraged and answered preoperatively.  OPERATIVE PROCEDURE:  The patient was seen by myself and Anesthesia, taken to the operative theater and underwent smooth induction of general anesthesia.  Following this, he was prepped and draped in usual sterile fashion by myself with a pre-scrub of Hibiclens followed by 10 minutes of surgical Betadine scrub and paint about the right upper extremity and a 10 minutes of surgical scrub about the right thigh for skin graft harvest preparation.  Once this was complete, I then identified the arm.  I very carefully and cautiously performed debridement of skin and subcutaneous tissue, excisional in nature, as well as muscular tissue. The patient's carpal canal was reopened, median nerve underwent a brief neurolysis to check it for its integrity and look.  It looked well vascularized without complicating feature.  A median nerve neurolysis was accomplished without difficulty and this was checked thoroughly.  Following this, I performed a palmaris longus tenotomy.  This was resected and back to its muscular tendinous origin.  This tenotomy  was performed without difficulty.  Following this, we then performed irrigation and debridement with greater than 3 liters of fluid.  Following this, I then performed partial closure of the wound.  The carpal canal was closed and the distal forearm wound was closed.  This allowed for excellent coverage of the median nerve.  I did place a TLS drain over this area.  At this time, I felt that the remaining areas would be  best served with skin graft as I did not want to tighten him too aggressively.  I felt the skin grafting would be the best option.  At this time, I brought in x-ray.  Once again, the third and fourth metacarpals were highly stable, did not move under live fluoro.  The hook of the hamate was stable and the distal radius was highly stable after ORIF.  At this juncture, we then harvested a split-thickness skin graft from the right thigh, it was meshed 1-1.5, and following this, it was inset with 4-0 Prolene sutures, spaced approximately 2 inches part and following this, a running 5-0 chromic to stuff the graft against the skin edge.  It laid against muscular tissue nicely.  The median nerve was deep to the skin graft site and distally was covered by direct closure.  Mepilex followed by Xeroform, cotton balls, gauze and cotton bolster dressing was applied.  Following this, we then very carefully dressed the wound.  The patient tolerated this well.  There were no complicating features.  After the wound was dressed, I placed him in a splint.  The thigh was dressed with Xeroform followed by Mepilex followed by gauze, ABDs and a 6-inch Ace wrap.  The patient tolerated the procedure well.  There were no complicating features.  All sponge, needle, and instrument counts were reported as correct.  We will be continuing his admit status.  We will continue IV antibiotics, general postop observation and other measures.  Should any problems occur, we will be immediately available; otherwise, look forward to seeing him while in the hospital and of course as an outpatient. We will give the skin graft 8-10 days before checking it and really concentrate on missing sling, range of motion and other measures.  Do's and don't have been discussed, and all questions have been encouraged and answered.  It was a pleasure to see him today and participate in his care.     Dionne Ano. Amanda Pea,  M.D.     [facility]  D:  04/29/2015  T:  04/30/2015  Job:  [id number]

## 2015-05-01 LAB — BASIC METABOLIC PANEL
Anion gap: 7 (ref 5–15)
BUN: 10 mg/dL (ref 6–20)
CO2: 31 mmol/L (ref 22–32)
Calcium: 9.1 mg/dL (ref 8.9–10.3)
Chloride: 102 mmol/L (ref 101–111)
Creatinine, Ser: 0.89 mg/dL (ref 0.61–1.24)
Glucose, Bld: 118 mg/dL — ABNORMAL HIGH (ref 65–99)
Potassium: 3.5 mmol/L (ref 3.5–5.1)
Sodium: 140 mmol/L (ref 135–145)

## 2015-05-01 MED ORDER — DOXYCYCLINE HYCLATE 100 MG PO CAPS: 100.0000 mg | ORAL_CAPSULE | Freq: Two times a day (BID) | ORAL | Status: DC

## 2015-05-01 MED ORDER — VITAMIN C 500 MG PO TABS: 1000.0000 mg | ORAL_TABLET | Freq: Every day | ORAL | Status: DC

## 2015-05-01 MED ORDER — SENNA-DOCUSATE SODIUM 8.6-50 MG PO TABS: 1.0000 | ORAL_TABLET | Freq: Every day | ORAL | Status: DC

## 2015-05-01 MED ORDER — SENNA 8.6 MG PO TABS: 1.0000 | ORAL_TABLET | Freq: Two times a day (BID) | ORAL | Status: DC

## 2015-05-01 MED ORDER — OXYCODONE HCL 5 MG PO TABS: 5.0000 mg | ORAL_TABLET | ORAL | Status: DC | PRN

## 2015-05-01 NOTE — Discharge Summary (Signed)
  Patient seen and examined.  Patient is anxious to go home.  He is ambulatory and moving his fingers well.  His physical examination notes no signs of infection dystrophy DVT UTI or other problems. He is sensate moving his fingers well and all looks well in terms of his physical exam findings.  The patient is alert and oriented in no acute distress. The patient complains of pain in the affected upper extremity.  The patient is noted to have a normal HEENT exam. Lung fields show equal chest expansion and no shortness of breath. Abdomen exam is nontender without distention. Lower extremity examination does not show any fracture dislocation or blood clot symptoms. Pelvis is stable and the neck and back are stable and nontender.  His hospital course is notable for surgery Tuesday night in the form of I and D compartment release and ORIF as the chart describes. He was taken to the operative theater Friday for repeat I and the skin graft and repair reconstruction. He tolerated both surgeries well.  He understands the postop out of rhythm and his plans for close follow-up.  He'll continue his out of work status. At time of discharge she is awake alert and oriented.  He will continue the drying technique to his thigh and keep the bandage clean and dry about his arm.  Discharge diet regular  Discharge medicines OxyIR, doxycycline 100 mg twice a day by mouth, vitamin C, Peri-Colace.  RTC 10 days.  Dominica Severin M.D.

## 2015-05-01 NOTE — Progress Notes (Signed)
ANTIBIOTIC CONSULT NOTE - FOLLOW UP  Pharmacy Consult:  Vancomycin Indication: post-op prophylaxis  Allergies  Allergen Reactions  . Amoxicillin Anaphylaxis and Hives  . Penicillins Anaphylaxis and Hives    Patient Measurements: Height:  (167.6 cm) Weight: 150 lb (68.04 kg) IBW/kg (Calculated) : 63.8  Vital Signs: Temp: 98.9 F (37.2 C) (07/31 0518) Temp Source: Oral (07/31 0518) BP: 113/62 mmHg (07/31 0518) Pulse Rate: 68 (07/31 0518) Intake/Output from previous day: 07/30 0701 - 07/31 0700 In: 960 [P.O.:360; IV Piggyback:600] Out: -   Labs: No results for input(s): WBC, HGB, PLT, LABCREA, CREATININE in the last 72 hours. Estimated Creatinine Clearance: 94.3 mL/min (by C-G formula based on Cr of 1.1). No results for input(s): VANCOTROUGH, VANCOPEAK, VANCORANDOM, GENTTROUGH, GENTPEAK, GENTRANDOM, TOBRATROUGH, TOBRAPEAK, TOBRARND, AMIKACINPEAK, AMIKACINTROU, AMIKACIN in the last 72 hours.   Microbiology: Recent Results (from the past 720 hour(s))  Surgical pcr screen     Status: None   Collection Time: 04/29/15  7:39 AM  Result Value Ref Range Status   MRSA, PCR NEGATIVE NEGATIVE Final   Staphylococcus aureus NEGATIVE NEGATIVE Final    Comment:        The Xpert SA Assay (FDA approved for NASAL specimens in patients over 59 years of age), is one component of a comprehensive surveillance program.  Test performance has been validated by [facility] for patients greater than or equal to 36 year old. It is not intended to diagnose infection nor to guide or monitor treatment.     Medical History: History reviewed. No pertinent past medical history.  Medications:  No prescriptions prior to admission     Assessment: 24 YOM presented after crush injury to right hand and wrist, s/p hand surgery 04/26/15.  Pharmacy consulted to continue vancomycin post-op for surgical prophylaxis.  It appears that patient received 2 doses on 04/29/15 AM due to OR visit.  His  renal function has improved.  Vanc 7/27 >>   Goal of Therapy:  Vancomycin trough level 10-15 mcg/ml   Plan:  - Vanc 1gm IV Q12H - Monitor renal fxn, clinical progress, vanc trough    Trayden Brandy D. Laney Potash, PharmD, BCPS Pager:  [phone] 05/01/2015, 7:42 AM

## 2015-05-01 NOTE — Discharge Instructions (Signed)
Please continue finger motion. Please elevate your hand. Please continue to dry your right thigh. Change the bandage on her right thigh daily apply hairdryer or fan to the area  Keep bandage clean and dry.  Call for any problems.  No smoking.  Criteria for driving a car: you should be off your pain medicine for 7-8 hours, able to drive one handed(confident), thinking clearly and feeling able in your judgement to drive. Continue elevation as it will decrease swelling.  If instructed by MD move your fingers within the confines of the bandage/splint.  Use ice if instructed by your MD. Call immediately for any sudden loss of feeling in your hand/arm or change in functional abilities of the extremity.We recommend that you to take vitamin C 1000 mg a day to promote healing. We also recommend that if you require  pain medicine that you take a stool softener to prevent constipation as most pain medicines will have constipation side effects. We recommend either Peri-Colace or Senokot and recommend that you also consider adding MiraLAX to prevent the constipation affects from pain medicine if you are required to use them. These medicines are over the counter and maybe purchased at a local pharmacy. A cup of yogurt and a probiotic can also be helpful during the recovery process as the medicines can disrupt your intestinal environment.  Please note that you're not to work until further notice

## 2015-05-02 ENCOUNTER — Encounter (HOSPITAL_COMMUNITY): Payer: Self-pay | Admitting: Orthopedic Surgery

## 2017-10-02 ENCOUNTER — Encounter (INDEPENDENT_AMBULATORY_CARE_PROVIDER_SITE_OTHER): Payer: Self-pay | Admitting: Physician Assistant

## 2017-10-02 ENCOUNTER — Other Ambulatory Visit: Payer: Self-pay

## 2017-10-02 ENCOUNTER — Ambulatory Visit (INDEPENDENT_AMBULATORY_CARE_PROVIDER_SITE_OTHER): Payer: Self-pay | Admitting: Physician Assistant

## 2017-10-02 VITALS — BP 114/60 | HR 62 | Temp 98.1°F | Ht 66.54 in | Wt 168.2 lb

## 2017-10-02 DIAGNOSIS — Z114 Encounter for screening for human immunodeficiency virus [HIV]: Secondary | ICD-10-CM

## 2017-10-02 DIAGNOSIS — Z0000 Encounter for general adult medical examination without abnormal findings: Secondary | ICD-10-CM

## 2017-10-02 DIAGNOSIS — Z23 Encounter for immunization: Secondary | ICD-10-CM

## 2017-10-02 NOTE — Patient Instructions (Signed)
Preventing Influenza, Adult Influenza, more commonly known as "the flu," is a viral infection that mainly affects the respiratory tract. The respiratory tract includes structures that help you breathe, such as the lungs, nose, and throat. The flu causes many common cold symptoms, as well as a high fever and body aches. The flu spreads easily from person to person (is contagious). The flu is most common from December through March. This is called flu season.You can catch the flu virus by:  Breathing in droplets from an infected person's cough or sneeze.  Touching something that was recently contaminated with the virus and then touching your mouth, nose, or eyes.  What can I do to lower my risk? You can decrease your risk of getting the flu by:  Getting a flu shot (influenza vaccination) every year. This is the best way to prevent the flu. A flu shot is recommended for everyone age 6 months and older. ? It is best to get a flu shot in the fall, as soon as it is available. Getting a flu shot during winter or spring instead is still a good idea. Flu season can last into early spring. ? Preventing the flu through vaccination requires getting a new flu shot every year. This is because the flu virus changes slightly (mutates) from one year to the next. Even if a flu shot does not completely protect you from all flu virus mutations, it can reduce the severity of your illness and prevent dangerous complications of the flu. ? If you are pregnant, you can and should get a flu shot. ? If you have had a reaction to the shot in the past or if you are allergic to eggs, check with your health care provider before getting a flu shot. ? Sometimes the vaccine is available as a nasal spray. In some years, the nasal spray has not been as effective against the flu virus. Check with your health care provider if you have questions about this.  Practicing good health habits. This is especially important during flu  season. ? Avoid contact with people who are sick with flu or cold symptoms. ? Wash your hands with soap and water often. If soap and water are not available, use hand sanitizer. ? Avoid touching your hands to your face, especially when you have not washed your hands recently. ? Use a disinfectant to clean surfaces at home and at work that may be contaminated with the flu virus. ? Keep your body's disease-fighting system (immune system) in good shape by eating a healthy diet, drinking plenty of fluids, getting enough sleep, and exercising regularly.  If you do get the flu, avoid spreading it to others by:  Staying home until your symptoms have been gone for at least one day.  Covering your mouth and nose with your elbow when you cough or sneeze.  Avoiding close contact with others, especially babies and elderly people.  Why are these changes important? Getting a flu shot and practicing good health habits protects you as well as other people. If you get the flu, your friends, family, and co-workers are also at risk of getting it, because it spreads so easily to others. Each year, about 2 out of every 10 people get the flu. Having the flu can lead to complications, such as pneumonia, ear infection, and sinus infection. The flu also can be deadly, especially for babies, people older than age 65, and people who have serious long-term diseases. How is this treated? Most people recover   from the flu by resting at home and drinking plenty of fluids. However, a prescription antiviral medicine may reduce your flu symptoms and may make your flu go away sooner. This medicine must be started within a few days of getting flu symptoms. You can talk with your health care provider about whether you need an antiviral medicine. Antiviral medicine may be prescribed for people who are at risk for more serious flu symptoms. This includes people who:  Are older than age 65.  Are pregnant.  Have a condition that  makes the flu worse or more dangerous.  Where to find more information:  Centers for Disease Control and Prevention: www.cdc.gov/flu/index.htm  Flu.gov: www.flu.gov/prevention-vaccination  American Academy of Family Physicians: familydoctor.org/familydoctor/en/kids/vaccines/preventing-the-flu.html Contact a health care provider if:  You have influenza and you develop new symptoms.  You have: ? Chest pain. ? Diarrhea. ? A fever.  Your cough gets worse, or you produce more mucus. Summary  The best way to prevent the flu is to get a flu shot every year in the fall.  Even if you get the flu after you have received the yearly vaccine, your flu may be milder and go away sooner because of your flu shot.  If you get the flu, antiviral medicines that are started with a few days of symptoms may reduce your flu symptoms and may make your flu go away sooner.  You can also help prevent the flu by practicing good health habits. This information is not intended to replace advice given to you by your health care provider. Make sure you discuss any questions you have with your health care provider. Document Released: 10/02/2015 Document Revised: 05/26/2016 Document Reviewed: 05/26/2016 Elsevier Interactive Patient Education  2018 [employer]  
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
## 2017-10-03 ENCOUNTER — Telehealth (INDEPENDENT_AMBULATORY_CARE_PROVIDER_SITE_OTHER): Payer: Self-pay

## 2017-10-03 LAB — COMPREHENSIVE METABOLIC PANEL
ALBUMIN: 4.6 g/dL (ref 3.5–5.5)
ALT: 20 IU/L (ref 0–44)
AST: 23 IU/L (ref 0–40)
Albumin/Globulin Ratio: 1.8 (ref 1.2–2.2)
Alkaline Phosphatase: 82 IU/L (ref 39–117)
BILIRUBIN TOTAL: 1 mg/dL (ref 0.0–1.2)
BUN / CREAT RATIO: 13 (ref 9–20)
BUN: 13 mg/dL (ref 6–20)
CHLORIDE: 103 mmol/L (ref 96–106)
CO2: 26 mmol/L (ref 20–29)
CREATININE: 0.99 mg/dL (ref 0.76–1.27)
Calcium: 9.9 mg/dL (ref 8.7–10.2)
GFR calc Af Amer: 122 mL/min/{1.73_m2} (ref >59)
GFR calc non Af Amer: 105 mL/min/{1.73_m2} (ref >59)
Globulin, Total: 2.5 g/dL (ref 1.5–4.5)
Glucose: 92 mg/dL (ref 65–99)
Potassium: 4.5 mmol/L (ref 3.5–5.2)
SODIUM: 143 mmol/L (ref 134–144)
Total Protein: 7.1 g/dL (ref 6.0–8.5)

## 2017-10-03 LAB — CBC WITH DIFFERENTIAL/PLATELET
BASOS ABS: 0 10*3/uL (ref 0.0–0.2)
Basos: 1 %
EOS (ABSOLUTE): 0.1 10*3/uL (ref 0.0–0.4)
Eos: 1 %
Hematocrit: 42.2 % (ref 37.5–51.0)
Hemoglobin: 14.4 g/dL (ref 13.0–17.7)
Immature Grans (Abs): 0 10*3/uL (ref 0.0–0.1)
Immature Granulocytes: 0 %
Lymphocytes Absolute: 2.3 10*3/uL (ref 0.7–3.1)
Lymphs: 32 %
MCH: 30.2 pg (ref 26.6–33.0)
MCHC: 34.1 g/dL (ref 31.5–35.7)
MCV: 89 fL (ref 79–97)
MONOS ABS: 0.5 10*3/uL (ref 0.1–0.9)
Monocytes: 7 %
NEUTROS PCT: 59 %
Neutrophils Absolute: 4.3 10*3/uL (ref 1.4–7.0)
Platelets: 324 10*3/uL (ref 150–379)
RBC: 4.77 x10E6/uL (ref 4.14–5.80)
RDW: 13.1 % (ref 12.3–15.4)
WBC: 7.2 10*3/uL (ref 3.4–10.8)

## 2017-10-03 LAB — HIV ANTIBODY (ROUTINE TESTING W REFLEX): HIV Screen 4th Generation wRfx: NONREACTIVE

## 2017-10-03 NOTE — Telephone Encounter (Signed)
Left patient a message informing of normal labs. Tempestt S Roberts, CMA  

## 2017-10-03 NOTE — Telephone Encounter (Signed)
-----   Message from Loletta Specteroger David Gomez, PA-C sent at 10/03/2017  1:29 PM EST ----- Labs are normal.

## 2017-11-29 ENCOUNTER — Encounter (INDEPENDENT_AMBULATORY_CARE_PROVIDER_SITE_OTHER): Payer: Self-pay | Admitting: Physician Assistant

## 2017-11-29 ENCOUNTER — Ambulatory Visit (INDEPENDENT_AMBULATORY_CARE_PROVIDER_SITE_OTHER): Payer: 59 | Admitting: Physician Assistant

## 2017-11-29 VITALS — BP 102/66 | HR 90 | Temp 98.1°F | Resp 18 | Ht 66.14 in | Wt 166.0 lb

## 2017-11-29 DIAGNOSIS — R05 Cough: Secondary | ICD-10-CM | POA: Diagnosis not present

## 2017-11-29 DIAGNOSIS — R058 Other specified cough: Secondary | ICD-10-CM

## 2017-11-29 MED ORDER — PHENYLEPHRINE HCL 1 % NA SOLN: 1.0000 [drp] | Freq: Four times a day (QID) | NASAL | 0 refills | Status: DC | PRN

## 2017-11-29 MED ORDER — BENZONATATE 200 MG PO CAPS: 200.0000 mg | ORAL_CAPSULE | Freq: Two times a day (BID) | ORAL | 0 refills | Status: DC | PRN

## 2017-11-29 NOTE — Patient Instructions (Signed)
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
## 2018-09-11 ENCOUNTER — Other Ambulatory Visit: Payer: Self-pay

## 2018-09-11 ENCOUNTER — Ambulatory Visit (INDEPENDENT_AMBULATORY_CARE_PROVIDER_SITE_OTHER): Payer: 59 | Admitting: Physician Assistant

## 2018-09-11 ENCOUNTER — Encounter (INDEPENDENT_AMBULATORY_CARE_PROVIDER_SITE_OTHER): Payer: Self-pay | Admitting: Physician Assistant

## 2018-09-11 VITALS — BP 139/78 | HR 67 | Temp 98.2°F | Ht 66.0 in | Wt 163.6 lb

## 2018-09-11 DIAGNOSIS — Z025 Encounter for examination for participation in sport: Secondary | ICD-10-CM | POA: Diagnosis not present
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
## 2018-11-26 ENCOUNTER — Ambulatory Visit (HOSPITAL_COMMUNITY)
Admission: EM | Admit: 2018-11-26 | Discharge: 2018-11-26 | Disposition: A | Discharge status: Home / Self Care | Payer: 59 | Attending: Family Medicine | Admitting: Family Medicine

## 2018-11-26 ENCOUNTER — Encounter (HOSPITAL_COMMUNITY): Payer: Self-pay

## 2018-11-26 DIAGNOSIS — M778 Other enthesopathies, not elsewhere classified: Secondary | ICD-10-CM

## 2018-11-26 DIAGNOSIS — M779 Enthesopathy, unspecified: Secondary | ICD-10-CM | POA: Diagnosis not present

## 2018-11-26 MED ORDER — DICLOFENAC SODIUM 75 MG PO TBEC: 75.0000 mg | DELAYED_RELEASE_TABLET | Freq: Two times a day (BID) | ORAL | 0 refills | Status: AC

## 2018-11-26 NOTE — ED Triage Notes (Signed)
Pt presents with left elbow pain.

## 2018-11-29 NOTE — ED Provider Notes (Signed)
[facility]   [id number] 11/26/18 Arrival Time: 1629  ASSESSMENT & PLAN:  1. Triceps tendonitis     Meds ordered this encounter  Medications  . diclofenac (VOLTAREN) 75 MG EC tablet    Sig: Take 1 tablet (75 mg total) by mouth 2 (two) times daily.    Dispense:  14 tablet    Refill:  0   Hold off on any weight lifting until better.  Recommend: Follow-up Information    [facility], [facility]   Specialty:  Specialist Why:  Schedule an appointment if not improving over the next week. Contact information: [address] [place] [address] [phone]           Reviewed expectations re: course of current medical issues. Questions answered. Outlined signs and symptoms indicating need for more acute intervention. Patient verbalized understanding. After Visit Summary given.  SUBJECTIVE: History from: patient. Jerome Horton is a 28 y.o. male who reports persistent moderate pain of his left upper arm; described as aching without radiation. Onset: abrupt, a few days ago. Injury/trama: no, but has been lifting weights more frequently. Symptoms have progressed to a point and plateaued since beginning. Aggravating factors: movement. Alleviating factors: rest. Associated symptoms: none reported. Extremity sensation changes or weakness: none. Self treatment: tried OTCs without relief of pain. History of similar: no.  Past Surgical History:  Procedure Laterality Date  . FASCIECTOMY Right 04/26/2015   Procedure: FASCIECTOMY;  Surgeon: Dominica Severin, MD;  Location: [facility] OR;  Service: Orthopedics;  Laterality: Right;  . I&D EXTREMITY Right 04/29/2015   Procedure: IRRIGATION AND DEBRIDEMENT RIGHT HAND,  FOREARM WITH PARTIAL CLOSURE AND SPLIT THICKNESS SKIN GRAFTING FROM RIGHT ANTEROLATERAL THIGH DONOR SITE;  Surgeon: Dominica Severin, MD;  Location: [facility] OR;  Service: Orthopedics;  Laterality: Right;  . ORIF WRIST FRACTURE Right 04/26/2015   Procedure: OPEN REDUCTION  INTERNAL FIXATION (ORIF) WRIST FRACTURE;  Surgeon: Dominica Severin, MD;  Location: [facility] OR;  Service: Orthopedics;  Laterality: Right;     ROS: As per HPI.   OBJECTIVE:  Vitals:   11/26/18 1705  BP: 135/86  Pulse: 60  Resp: 18  Temp: 98.4 F (36.9 C)  TempSrc: Oral  SpO2: 100%    General appearance: alert; no distress Extremities: . LUE: warm and well perfused; fairly well localized moderate tenderness over left triceps with sign of rupture; without gross deformities; with no swelling; with no bruising; ROM: normal CV: brisk extremity capillary refill of LUE; 2+ radial pulse of LUE. Skin: warm and dry; no visible rashes Neurologic: gait normal; normal reflexes of RUE and LUE; normal sensation of RUE and LUE; normal strength of RUE and LUE Psychological: alert and cooperative; normal mood and affect  Allergies  Allergen Reactions  . Amoxicillin Anaphylaxis and Hives  . Penicillins Anaphylaxis and Hives    History reviewed. No pertinent past medical history. Social History   Socioeconomic History  . Marital status: Single    Spouse name: Not on file  . Number of children: Not on file  . Years of education: Not on file  . Highest education level: Not on file  Occupational History  . Not on file  Social Needs  . Financial resource strain: Not on file  . Food insecurity:    Worry: Not on file    Inability: Not on file  . Transportation needs:    Medical: Not on file    Non-medical: Not on file  Tobacco Use  . Smoking status: Never Smoker  . Smokeless  tobacco: Never Used  Substance and Sexual Activity  . Alcohol use: No  . Drug use: No  . Sexual activity: Yes    Partners: Female    Birth control/protection: Condom  Lifestyle  . Physical activity:    Days per week: Not on file    Minutes per session: Not on file  . Stress: Not on file  Relationships  . Social connections:    Talks on phone: Not on file    Gets together: Not on file    Attends religious  service: Not on file    Active member of club or organization: Not on file    Attends meetings of clubs or organizations: Not on file    Relationship status: Not on file  Other Topics Concern  . Not on file  Social History Narrative  . Not on file   Family History  Problem Relation Age of Onset  . Healthy Mother   . Healthy Father    Past Surgical History:  Procedure Laterality Date  . FASCIECTOMY Right 04/26/2015   Procedure: FASCIECTOMY;  Surgeon: Dominica Severin, MD;  Location: [facility] OR;  Service: Orthopedics;  Laterality: Right;  . I&D EXTREMITY Right 04/29/2015   Procedure: IRRIGATION AND DEBRIDEMENT RIGHT HAND,  FOREARM WITH PARTIAL CLOSURE AND SPLIT THICKNESS SKIN GRAFTING FROM RIGHT ANTEROLATERAL THIGH DONOR SITE;  Surgeon: Dominica Severin, MD;  Location: [facility] OR;  Service: Orthopedics;  Laterality: Right;  . ORIF WRIST FRACTURE Right 04/26/2015   Procedure: OPEN REDUCTION INTERNAL FIXATION (ORIF) WRIST FRACTURE;  Surgeon: Dominica Severin, MD;  Location: [facility] OR;  Service: Orthopedics;  Laterality: Right;      Mardella Layman, MD 12/10/18 [phone]

## 2019-02-17 ENCOUNTER — Other Ambulatory Visit: Payer: Self-pay

## 2019-02-17 ENCOUNTER — Encounter (HOSPITAL_COMMUNITY): Payer: Self-pay | Admitting: Emergency Medicine

## 2019-02-17 ENCOUNTER — Emergency Department (HOSPITAL_COMMUNITY)
Admission: EM | Admit: 2019-02-17 | Discharge: 2019-02-17 | Disposition: A | Discharge status: Home / Self Care | Payer: 59 | Attending: Emergency Medicine | Admitting: Emergency Medicine

## 2019-02-17 DIAGNOSIS — Z79899 Other long term (current) drug therapy: Secondary | ICD-10-CM | POA: Diagnosis not present

## 2019-02-17 DIAGNOSIS — Z041 Encounter for examination and observation following transport accident: Secondary | ICD-10-CM | POA: Diagnosis not present

## 2019-02-17 DIAGNOSIS — M79661 Pain in right lower leg: Secondary | ICD-10-CM | POA: Insufficient documentation

## 2019-02-17 MED ORDER — ACETAMINOPHEN 325 MG PO TABS: 650.0000 mg | ORAL_TABLET | Freq: Once | ORAL | Status: DC

## 2019-02-17 NOTE — ED Notes (Signed)
Bed: [facility] Expected date:  Expected time:  Means of arrival:  Comments: 

## 2019-02-17 NOTE — ED Notes (Signed)
ED Provider at bedside. 
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
## 2019-02-17 NOTE — Discharge Instructions (Addendum)

## 2020-03-12 ENCOUNTER — Other Ambulatory Visit: Payer: Self-pay

## 2020-03-12 ENCOUNTER — Emergency Department (HOSPITAL_COMMUNITY)
Admission: EM | Admit: 2020-03-12 | Discharge: 2020-03-12 | Disposition: A | Discharge status: Home / Self Care | Payer: 59 | Attending: Emergency Medicine | Admitting: Emergency Medicine

## 2020-03-12 ENCOUNTER — Encounter (HOSPITAL_COMMUNITY): Payer: Self-pay

## 2020-03-12 DIAGNOSIS — J45909 Unspecified asthma, uncomplicated: Secondary | ICD-10-CM | POA: Insufficient documentation

## 2020-03-12 DIAGNOSIS — J3489 Other specified disorders of nose and nasal sinuses: Secondary | ICD-10-CM | POA: Insufficient documentation

## 2020-03-12 DIAGNOSIS — R0981 Nasal congestion: Secondary | ICD-10-CM | POA: Insufficient documentation

## 2020-03-12 DIAGNOSIS — H9202 Otalgia, left ear: Secondary | ICD-10-CM | POA: Insufficient documentation

## 2020-03-12 DIAGNOSIS — Z79899 Other long term (current) drug therapy: Secondary | ICD-10-CM | POA: Insufficient documentation

## 2020-03-12 DIAGNOSIS — Z880 Allergy status to penicillin: Secondary | ICD-10-CM | POA: Insufficient documentation

## 2020-03-12 HISTORY — DX: Unspecified asthma, uncomplicated: J45.909

## 2020-03-12 MED ORDER — FLUTICASONE PROPIONATE 50 MCG/ACT NA SUSP: 2.0000 | Freq: Two times a day (BID) | NASAL | 0 refills | Status: AC

## 2020-03-12 MED ORDER — ONDANSETRON 4 MG PO TBDP: 4.0000 mg | ORAL_TABLET | Freq: Three times a day (TID) | ORAL | 0 refills | Status: AC | PRN

## 2020-03-12 MED ORDER — PSEUDOEPHEDRINE HCL 30 MG PO TABS: 30.0000 mg | ORAL_TABLET | ORAL | 0 refills | Status: AC | PRN

## 2020-03-12 MED ORDER — CARBAMIDE PEROXIDE 6.5 % OT SOLN: 5.0000 [drp] | Freq: Two times a day (BID) | OTIC | 0 refills | Status: AC

## 2020-03-12 MED ORDER — DIPHENHYDRAMINE HCL 25 MG PO TABS: 25.0000 mg | ORAL_TABLET | Freq: Four times a day (QID) | ORAL | 0 refills | Status: AC | PRN

## 2020-03-12 MED ORDER — CETIRIZINE HCL 10 MG PO TABS: 10.0000 mg | ORAL_TABLET | Freq: Every day | ORAL | 0 refills | Status: AC

## 2020-03-12 NOTE — Discharge Instructions (Addendum)
Please a medications as prescribed.  Please follow-up with [facility].  If you develop any dental pain please be seen by a dentist.  The eardrops to soften the wax which should help with removal. If you continue to have issues with your left ear please call your residents or doctors for his phone number I have provided you with.

## 2020-03-12 NOTE — ED Provider Notes (Signed)
[facility] DEPT Provider Note   CSN: [id number] Arrival date & time: 03/12/20  1754     History Chief Complaint  Patient presents with  . Otalgia  . Emesis    Jerome Horton is a 29 y.o. male.  HPI  Patient is a 29 year old male with 2 days of left ear ache, congestion, popping in his ears.  He states he also has some decreased hearing.  He states he has a history with chronic earwax buildup.  He denies any attempted irrigation or foreign bodies in his ears.  He states that yesterday he had 1 episode of emesis that was nonbloody nonbilious.  He states he is otherwise feeling well no fevers chills cough lightheadedness or dizziness.   He states that your pain is achy and mild.  He states it is intermittent.  Associated with a popping sensation.  He states he does have a somewhat mild associated sore throat.    Past Medical History:  Diagnosis Date  . Asthma     Patient Active Problem List   Diagnosis Date Noted  . Crush injury of hand 04/26/2015    Past Surgical History:  Procedure Laterality Date  . FASCIECTOMY Right 04/26/2015   Procedure: FASCIECTOMY;  Surgeon: Roseanne Kaufman, MD;  Location: [facility];  Service: Orthopedics;  Laterality: Right;  . I & D EXTREMITY Right 04/29/2015   Procedure: IRRIGATION AND DEBRIDEMENT RIGHT HAND,  FOREARM WITH PARTIAL CLOSURE AND SPLIT THICKNESS SKIN GRAFTING FROM RIGHT ANTEROLATERAL THIGH DONOR SITE;  Surgeon: Roseanne Kaufman, MD;  Location: [facility];  Service: Orthopedics;  Laterality: Right;  . ORIF WRIST FRACTURE Right 04/26/2015   Procedure: OPEN REDUCTION INTERNAL FIXATION (ORIF) WRIST FRACTURE;  Surgeon: Roseanne Kaufman, MD;  Location: [facility];  Service: Orthopedics;  Laterality: Right;       Family History  Problem Relation Age of Onset  . Healthy Mother   . Healthy Father     Social History   Tobacco Use  . Smoking status: Never Smoker  . Smokeless tobacco: Never Used  Vaping Use  . Vaping Use: Never  used  Substance Use Topics  . Alcohol use: No  . Drug use: No    Home Medications Prior to Admission medications   Medication Sig Start Date End Date Taking? Authorizing Provider  carbamide peroxide (DEBROX) 6.5 % OTIC solution Place 5 drops into both ears 2 (two) times daily. 03/12/20   Tedd Sias, PA  cetirizine (ZYRTEC ALLERGY) 10 MG tablet Take 1 tablet (10 mg total) by mouth daily for 21 days. 03/12/20 04/02/20  Tedd Sias, PA  diclofenac (VOLTAREN) 75 MG EC tablet Take 1 tablet (75 mg total) by mouth 2 (two) times daily. 11/26/18   Vanessa Kick, MD  diphenhydrAMINE (BENADRYL) 25 MG tablet Take 1 tablet (25 mg total) by mouth every 6 (six) hours as needed. 03/12/20   Erdem Naas, Kathleene Hazel, PA  fluticasone (FLONASE) 50 MCG/ACT nasal spray Place 2 sprays into both nostrils in the morning and at bedtime for 21 days. 03/12/20 04/02/20  Tedd Sias, PA  ondansetron (ZOFRAN ODT) 4 MG disintegrating tablet Take 1 tablet (4 mg total) by mouth every 8 (eight) hours as needed for nausea or vomiting. 03/12/20   Tedd Sias, PA  pseudoephedrine (SUDAFED) 30 MG tablet Take 1 tablet (30 mg total) by mouth every 4 (four) hours as needed for congestion. 03/12/20   Tedd Sias, PA    Allergies    Amoxicillin and Penicillins  Review of Systems   Review of Systems  Constitutional: Negative for chills, fatigue and fever.  HENT: Positive for congestion, ear pain, rhinorrhea and sore throat. Negative for ear discharge and hearing loss.   Respiratory: Negative for shortness of breath.   Cardiovascular: Negative for chest pain.  Gastrointestinal: Negative for abdominal pain.  Musculoskeletal: Negative for neck pain.    Physical Exam Updated Vital Signs BP 137/83 (BP Location: Right Arm)   Pulse 82   Temp 98.1 F (36.7 C) (Oral)   Resp 16   Ht 5\' 9"  (1.753 m)   Wt 77.6 kg   SpO2 98%   BMI 25.25 kg/m   Physical Exam Vitals and nursing note reviewed.  Constitutional:       General: He is not in acute distress.    Appearance: Normal appearance. He is not ill-appearing.  HENT:     Head: Normocephalic and atraumatic.     Ears:     Comments: Bilateral ears with significant cerumen however TM is visible and without injection or erythema bilaterally.  No significant tenderness with tragus touch.    Nose: Rhinorrhea present.     Mouth/Throat:     Mouth: Mucous membranes are moist.     Comments: Attention shin is normal with no gingivitis or gingival erythema or swelling no obvious periapical abscess Eyes:     General: No scleral icterus.       Right eye: No discharge.        Left eye: No discharge.     Conjunctiva/sclera: Conjunctivae normal.  Pulmonary:     Effort: Pulmonary effort is normal.     Breath sounds: No stridor.  Musculoskeletal:     Cervical back: Normal range of motion and neck supple. No tenderness.  Lymphadenopathy:     Cervical: No cervical adenopathy.  Skin:    General: Skin is warm and dry.  Neurological:     Mental Status: He is alert and oriented to person, place, and time. Mental status is at baseline.     ED Results / Procedures / Treatments   Labs (all labs ordered are listed, but only abnormal results are displayed) Labs Reviewed - No data to display  EKG None  Radiology No results found.  Procedures Procedures (including critical care time)  Medications Ordered in ED Medications - No data to display  ED Course  I have reviewed the triage vital signs and the nursing notes.  Pertinent labs & imaging results that were available during my care of the patient were reviewed by me and considered in my medical decision making (see chart for details).    MDM Rules/Calculators/A&P                          Left ear pain, congestion, ear popping sounds and mild sore throat.  This is been ongoing for 2 days.  Vital signs within normal limits.  Physical exam is notable for some congestion and rhinorrhea, normal ear exam  apart from some earwax.  No lymphadenopathy notable.  He is generally well-appearing.  He has had 1 episode of vomiting I suspect this is unrelated to his symptoms however he has no abdominal pain, fevers or chills.  He is nontoxic-appearing.  High suspicion for eustachian tube dysfunction.  Patient given Zofran, Zyrtec, Benadryl, Flonase, Debrox solution for his earwax and recommended to use Sudafed as well.  Patient is followed by the counseling clinic.  I have also provided him with  ENT referral for his earwax impaction and increase his symptoms do not improve.  Final Clinical Impression(s) / ED Diagnoses Final diagnoses:  Left ear pain  Complaint of nasal congestion    Rx / DC Orders ED Discharge Orders         Ordered    ondansetron (ZOFRAN ODT) 4 MG disintegrating tablet  Every 8 hours PRN     Discontinue  Reprint     03/12/20 2037    cetirizine (ZYRTEC ALLERGY) 10 MG tablet  Daily     Discontinue  Reprint     03/12/20 2037    diphenhydrAMINE (BENADRYL) 25 MG tablet  Every 6 hours PRN     Discontinue  Reprint     03/12/20 2037    fluticasone (FLONASE) 50 MCG/ACT nasal spray  2 times daily     Discontinue  Reprint     03/12/20 2037    carbamide peroxide (DEBROX) 6.5 % OTIC solution  2 times daily     Discontinue  Reprint     03/12/20 2037    pseudoephedrine (SUDAFED) 30 MG tablet  Every 4 hours PRN     Discontinue     03/12/20 2044           Gailen Shelter, [place] 03/12/20 2045    Vanetta Mulders, MD 03/21/20 [phone]

## 2020-03-12 NOTE — ED Triage Notes (Signed)
Patient c/o left ear pain tht radiates into the left jaw since yesterday. Patient states he had vomiting yesterday, but no vomiting today.

## 2020-08-09 ENCOUNTER — Emergency Department (HOSPITAL_COMMUNITY)
Admission: EM | Admit: 2020-08-09 | Discharge: 2020-08-09 | Disposition: A | Discharge status: Home / Self Care | Payer: Self-pay | Attending: Emergency Medicine | Admitting: Emergency Medicine

## 2020-08-09 ENCOUNTER — Other Ambulatory Visit: Payer: Self-pay

## 2020-08-09 ENCOUNTER — Encounter (HOSPITAL_COMMUNITY): Payer: Self-pay | Admitting: Emergency Medicine

## 2020-08-09 DIAGNOSIS — M545 Low back pain, unspecified: Secondary | ICD-10-CM | POA: Insufficient documentation

## 2020-08-09 DIAGNOSIS — J45909 Unspecified asthma, uncomplicated: Secondary | ICD-10-CM | POA: Insufficient documentation

## 2020-08-09 DIAGNOSIS — Y92410 Unspecified street and highway as the place of occurrence of the external cause: Secondary | ICD-10-CM | POA: Insufficient documentation

## 2020-08-09 NOTE — Discharge Instructions (Signed)
At this time there does not appear to be the presence of an emergent medical condition, however there is always the potential for conditions to change. Please read and follow the below instructions.  Please return to the Emergency Department immediately for any new or worsening symptoms. Please be sure to follow up with your Primary Care Provider within one week regarding your visit today; please call their office to schedule an appointment even if you are feeling better for a follow-up visit.  Go to the nearest Emergency Department immediately if: You have fever or chills You have: Loss of feeling (numbness), tingling, or weakness in your arms or legs. Very bad neck pain, especially tenderness in the middle of the back of your neck. A change in your ability to control your pee or poop (stool). More pain in any area of your body. Swelling in any area of your body, especially your legs. Shortness of breath or light-headedness. Chest pain. Blood in your pee, poop, or vomit. Very bad pain in your belly (abdomen) or your back. Very bad headaches or headaches that are getting worse. Sudden vision loss or double vision. Your eye suddenly turns red. The black center of your eye (pupil) is an odd shape or size. You develop new bowel or bladder control problems. You have unusual weakness or numbness in your arms or legs. You develop nausea or vomiting. You develop abdominal pain. You feel faint. You have any new/concerning or worsening of symptoms   Please read the additional information packets attached to your discharge summary.  Do not take your medicine if  develop an itchy rash, swelling in your mouth or lips, or difficulty breathing; call 911 and seek immediate emergency medical attention if this occurs.  You may review your lab tests and imaging results in their entirety on your MyChart account.  Please discuss all results of fully with your primary care provider and other specialist  at your follow-up visit.  Note: Portions of this text may have been transcribed using voice recognition software. Every effort was made to ensure accuracy; however, inadvertent computerized transcription errors may still be present.
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
## 2020-08-09 NOTE — ED Notes (Signed)
Provider notified
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
## 2021-02-10 ENCOUNTER — Encounter (HOSPITAL_COMMUNITY): Payer: Self-pay

## 2021-02-10 ENCOUNTER — Emergency Department (HOSPITAL_COMMUNITY): Payer: BC Managed Care – PPO

## 2021-02-10 ENCOUNTER — Other Ambulatory Visit: Payer: Self-pay

## 2021-02-10 ENCOUNTER — Emergency Department (HOSPITAL_COMMUNITY)
Admission: EM | Admit: 2021-02-10 | Discharge: 2021-02-10 | Disposition: A | Discharge status: Home / Self Care | Payer: BC Managed Care – PPO | Attending: Emergency Medicine | Admitting: Emergency Medicine

## 2021-02-10 DIAGNOSIS — Z20822 Contact with and (suspected) exposure to covid-19: Secondary | ICD-10-CM | POA: Insufficient documentation

## 2021-02-10 DIAGNOSIS — M791 Myalgia, unspecified site: Secondary | ICD-10-CM | POA: Diagnosis present

## 2021-02-10 DIAGNOSIS — B349 Viral infection, unspecified: Secondary | ICD-10-CM | POA: Insufficient documentation

## 2021-02-10 DIAGNOSIS — J45909 Unspecified asthma, uncomplicated: Secondary | ICD-10-CM | POA: Diagnosis not present

## 2021-02-10 LAB — RESP PANEL BY RT-PCR (FLU A&B, COVID) ARPGX2
Influenza A by PCR: NEGATIVE
Influenza B by PCR: NEGATIVE
SARS Coronavirus 2 by RT PCR: NEGATIVE

## 2021-02-10 NOTE — ED Provider Notes (Signed)
[facility]-EMERGENCY DEPT Provider Note   CSN: [id number] Arrival date & time: 02/10/21  1021     History Chief Complaint  Patient presents with  . Nausea  . Headache  . Dizziness    Jerome Horton is a 30 y.o. male.  30 year old male with prior medical history as detailed below presents for evaluation.  Patient reports 5 to 6 days of myalgia, fatigue, congestion, and mild headache.  He denies fever.  He denies shortness of breath.    The history is provided by the patient and medical records.  Illness Location:  Myalgia, fatigue, congestion Severity:  Mild Onset quality:  Gradual Duration:  6 days Timing:  Constant Progression:  Waxing and waning Chronicity:  New      Past Medical History:  Diagnosis Date  . Asthma     Patient Active Problem List   Diagnosis Date Noted  . Crush injury of hand 04/26/2015    Past Surgical History:  Procedure Laterality Date  . FASCIECTOMY Right 04/26/2015   Procedure: FASCIECTOMY;  Surgeon: Dominica Severin, MD;  Location: [facility] OR;  Service: Orthopedics;  Laterality: Right;  . I & D EXTREMITY Right 04/29/2015   Procedure: IRRIGATION AND DEBRIDEMENT RIGHT HAND,  FOREARM WITH PARTIAL CLOSURE AND SPLIT THICKNESS SKIN GRAFTING FROM RIGHT ANTEROLATERAL THIGH DONOR SITE;  Surgeon: Dominica Severin, MD;  Location: [facility] OR;  Service: Orthopedics;  Laterality: Right;  . ORIF WRIST FRACTURE Right 04/26/2015   Procedure: OPEN REDUCTION INTERNAL FIXATION (ORIF) WRIST FRACTURE;  Surgeon: Dominica Severin, MD;  Location: [facility] OR;  Service: Orthopedics;  Laterality: Right;       Family History  Problem Relation Age of Onset  . Healthy Mother   . Healthy Father     Social History   Tobacco Use  . Smoking status: Never Smoker  . Smokeless tobacco: Never Used  Vaping Use  . Vaping Use: Never used  Substance Use Topics  . Alcohol use: No  . Drug use: No    Home Medications Prior to Admission medications   Medication Sig  Start Date End Date Taking? Authorizing Provider  carbamide peroxide (DEBROX) 6.5 % OTIC solution Place 5 drops into both ears 2 (two) times daily. 03/12/20   Gailen Shelter, PA  cetirizine (ZYRTEC ALLERGY) 10 MG tablet Take 1 tablet (10 mg total) by mouth daily for 21 days. 03/12/20 04/02/20  Gailen Shelter, PA  diclofenac (VOLTAREN) 75 MG EC tablet Take 1 tablet (75 mg total) by mouth 2 (two) times daily. 11/26/18   Mardella Layman, MD  diphenhydrAMINE (BENADRYL) 25 MG tablet Take 1 tablet (25 mg total) by mouth every 6 (six) hours as needed. 03/12/20   Fondaw, Rodrigo Ran, PA  fluticasone (FLONASE) 50 MCG/ACT nasal spray Place 2 sprays into both nostrils in the morning and at bedtime for 21 days. 03/12/20 04/02/20  Gailen Shelter, PA  ondansetron (ZOFRAN ODT) 4 MG disintegrating tablet Take 1 tablet (4 mg total) by mouth every 8 (eight) hours as needed for nausea or vomiting. 03/12/20   Gailen Shelter, PA  pseudoephedrine (SUDAFED) 30 MG tablet Take 1 tablet (30 mg total) by mouth every 4 (four) hours as needed for congestion. 03/12/20   Gailen Shelter, PA    Allergies    Amoxicillin and Penicillins  Review of Systems   Review of Systems  All other systems reviewed and are negative.   Physical Exam Updated Vital Signs BP 123/86 (BP Location: Left Arm)   Pulse  77   Temp 98.2 F (36.8 C) (Oral)   Resp 16   Ht 5\' 10"  (1.778 m)   Wt 77.1 kg   SpO2 99%   BMI 24.39 kg/m   Physical Exam Vitals and nursing note reviewed.  Constitutional:      General: He is not in acute distress.    Appearance: He is well-developed.  HENT:     Head: Normocephalic and atraumatic.  Eyes:     Conjunctiva/sclera: Conjunctivae normal.     Pupils: Pupils are equal, round, and reactive to light.  Cardiovascular:     Rate and Rhythm: Normal rate and regular rhythm.     Heart sounds: Normal heart sounds.  Pulmonary:     Effort: Pulmonary effort is normal. No respiratory distress.     Breath sounds: Normal  breath sounds.  Abdominal:     General: There is no distension.     Palpations: Abdomen is soft.     Tenderness: There is no abdominal tenderness.  Musculoskeletal:        General: No deformity. Normal range of motion.     Cervical back: Normal range of motion and neck supple.  Skin:    General: Skin is warm and dry.  Neurological:     Mental Status: He is alert and oriented to person, place, and time.     ED Results / Procedures / Treatments   Labs (all labs ordered are listed, but only abnormal results are displayed) Labs Reviewed  RESP PANEL BY RT-PCR (FLU A&B, COVID) [id number]    EKG None  Radiology DG Hand Complete Right  Result Date: 02/10/2021 CLINICAL DATA:  Right ring finger pain, discoloration at nail bed for approx 1 month, doesn't remember any trauma EXAM: RIGHT HAND - COMPLETE 3+ VIEW COMPARISON:  None. FINDINGS: There is no evidence of fracture or dislocation. There is no evidence of arthropathy or other focal bone abnormality. There is an orthopedic screw and partially visualized plate and screw hardware in the wrist and proximal radius. Soft tissues are unremarkable. No radiopaque foreign body. IMPRESSION: No acute osseous abnormality in the right ring finger. Electronically Signed   By: 02/12/2021 M.D.   On: 02/10/2021 11:20    Procedures Procedures   Medications Ordered in ED Medications - No data to display  ED Course  I have reviewed the triage vital signs and the nursing notes.  Pertinent labs & imaging results that were available during my care of the patient were reviewed by me and considered in my medical decision making (see chart for details).    MDM Rules/Calculators/A&P                          MDM  MSE complete  [name] was evaluated in Emergency Department on 02/10/2021 for the symptoms described in the history of present illness. He was evaluated in the context of the global COVID-19 pandemic, which necessitated consideration  that the patient might be at risk for infection with the SARS-CoV-2 virus that causes COVID-19. Institutional protocols and algorithms that pertain to the evaluation of patients at risk for COVID-19 are in a state of rapid change based on information released by regulatory bodies including the CDC and federal and state organizations. These policies and algorithms were followed during the patient's care in the ED.  Patient is presenting with constellation of symptoms most consistent with likely viral syndrome.  COVID and flu testing performed.  Patient understands how  to follow-up on these results through MyChart.  Patient without other suggestion of more significant pathology.  Importance of close follow-up stressed.  Strict return precautions given and understood.   Final Clinical Impression(s) / ED Diagnoses Final diagnoses:  Viral syndrome    Rx / DC Orders ED Discharge Orders    None       Wynetta Fines, MD 02/10/21 1215

## 2021-02-10 NOTE — ED Provider Notes (Signed)
Emergency Medicine Provider Triage Evaluation Note  Jerome Horton , a 30 y.o. male  was evaluated in triage.  Pt complains of migraines for 1 week. Reports nausea, rhinorrhea, and congestion as well. He is also c/o right 4th finger pain for 1 month.   Review of Systems  Positive: Headache, right finger pain Negative: Fever, vision changes, numbness/weakness  Physical Exam  BP 123/86 (BP Location: Left Arm)   Pulse 77   Temp 98.2 F (36.8 C) (Oral)   Resp 16   SpO2 99%  Gen:   Awake, no distress   Resp:  Normal effort  MSK:   Moves extremities without difficulty  Other:    Medical Decision Making  Medically screening exam initiated at 10:48 AM.  Appropriate orders placed.  Jerome Horton was informed that the remainder of the evaluation will be completed by another provider, this initial triage assessment does not replace that evaluation, and the importance of remaining in the ED until their evaluation is complete.    Jerome Horton 02/10/21 1048    Wynetta Fines, MD 02/11/21 1446

## 2021-02-10 NOTE — ED Triage Notes (Addendum)
Patient c/o nausea, headache, and dizziness x 1 week.  Patient c/o right  Ring finger pain x 2 months.

## 2021-02-10 NOTE — Discharge Instructions (Signed)
Return for any problem.  ?

## 2022-09-03 IMAGING — CR DG HAND COMPLETE 3+V*R*
3 series · 3 of 3 positions shown · non-contrast
Comparison: None.

CLINICAL DATA: Right ring finger pain, discoloration at nail bed
for approx 1 month, doesn't remember any trauma

EXAM:
RIGHT HAND - COMPLETE 3+ VIEW

[x hand pa right]
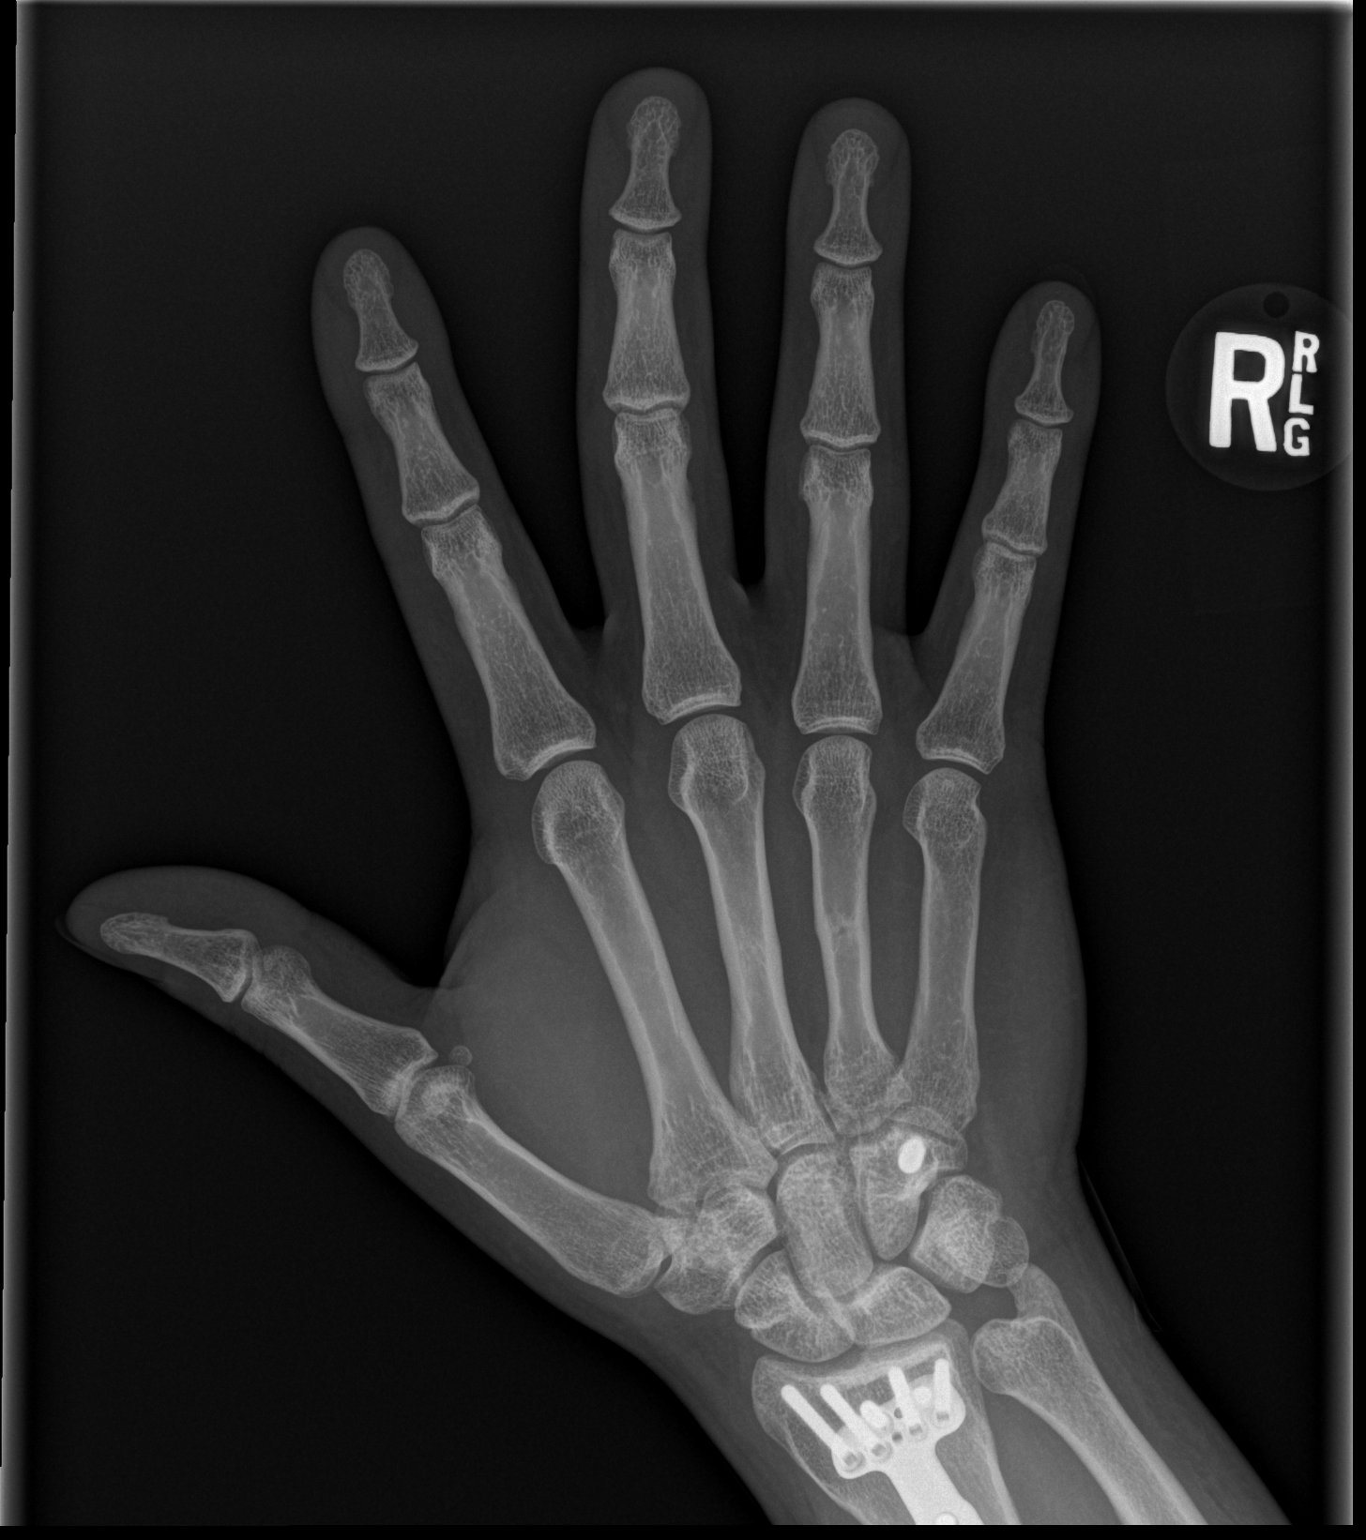

[x hand obl right]
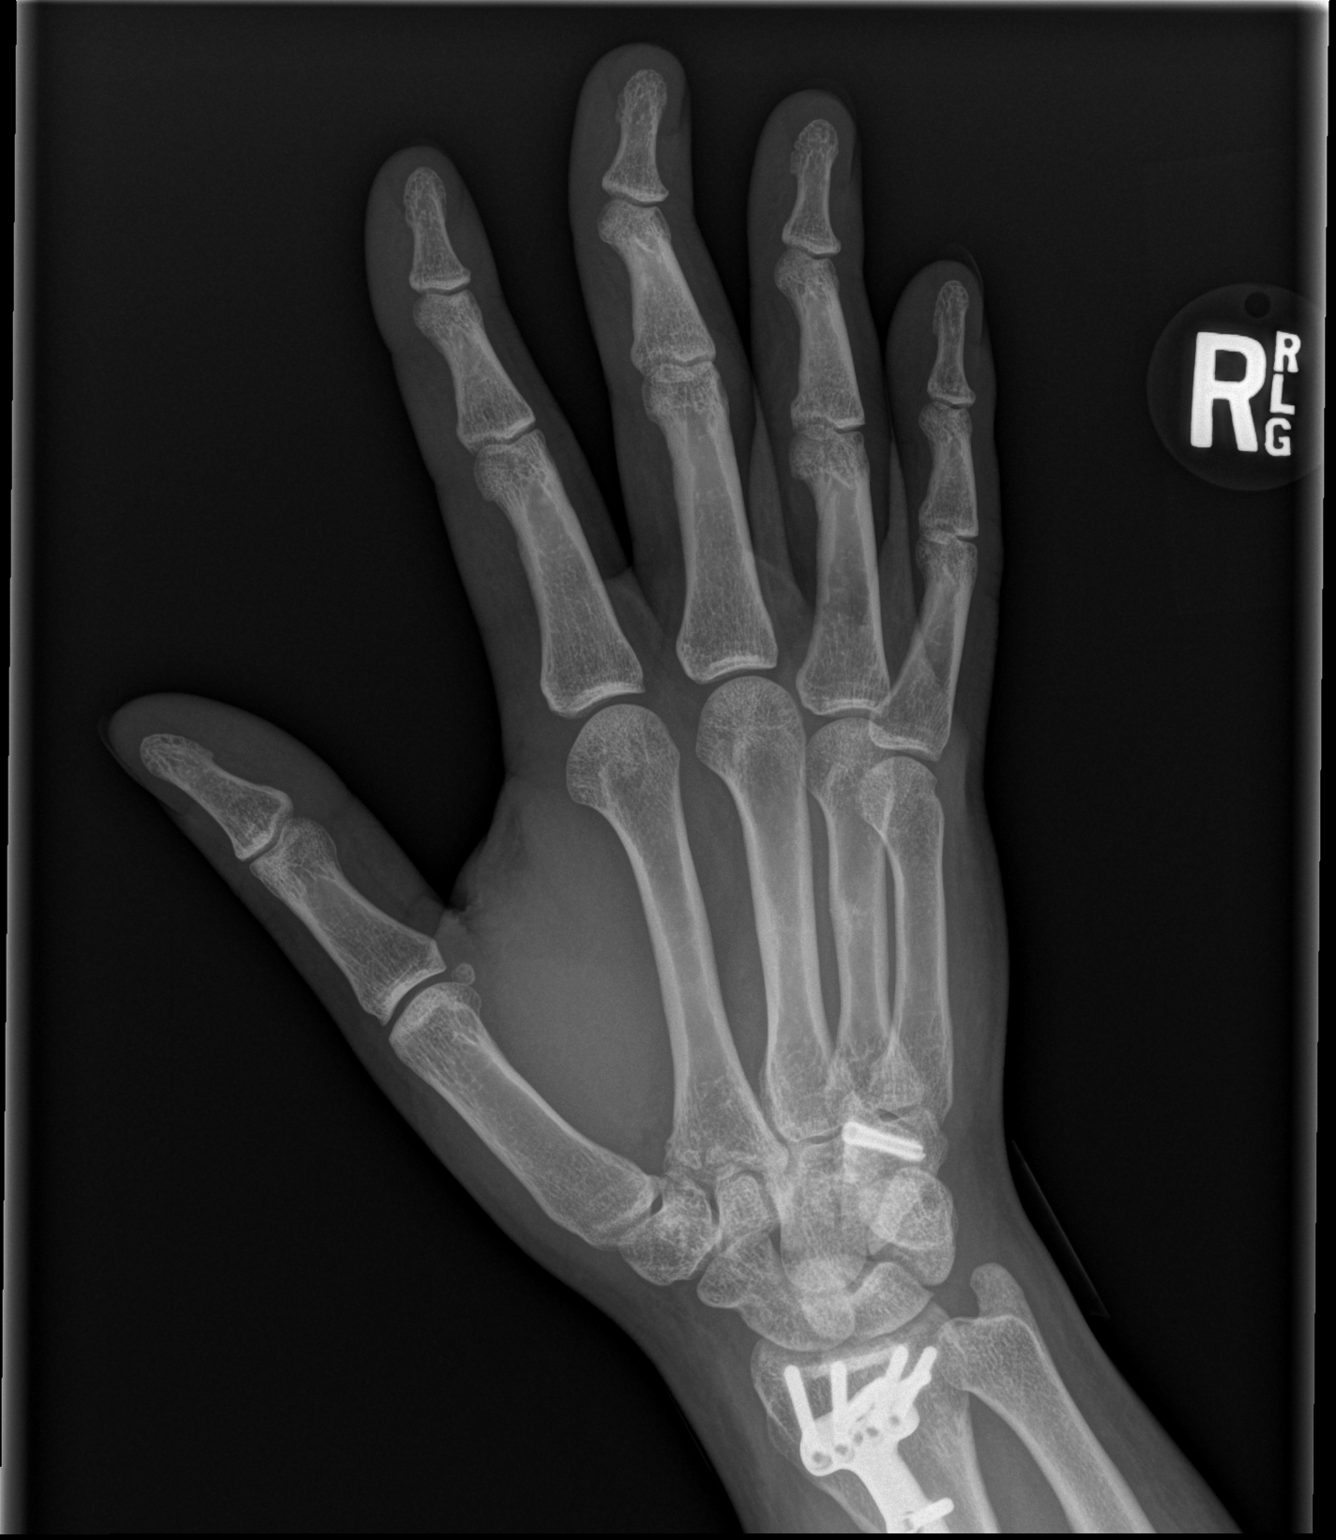

[x hand lat right]
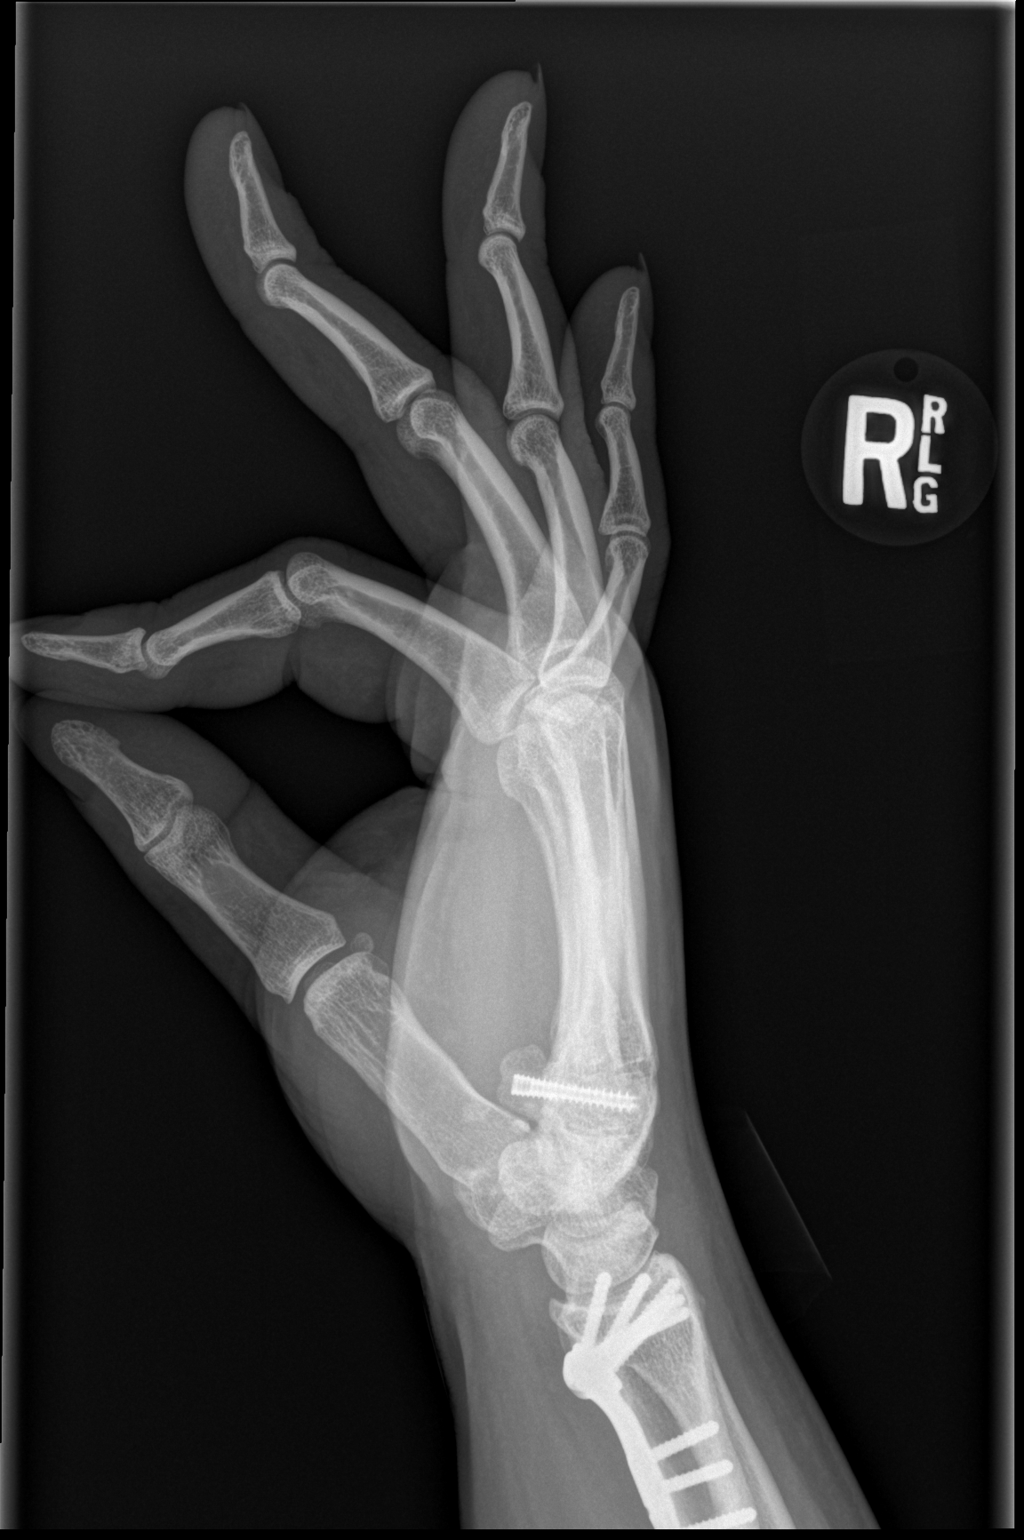

[3 of 3 positions shown; findings below may reference images not displayed]

FINDINGS: There is no evidence of fracture or dislocation. There is no
evidence of arthropathy or other focal bone abnormality. There is an
orthopedic screw and partially visualized plate and screw hardware
in the wrist and proximal radius. Soft tissues are unremarkable. No
radiopaque foreign body.
IMPRESSION: No acute osseous abnormality in the right ring finger.
